# Patient Record
Sex: Male | Born: 1980
Health system: Southern US, Community
[De-identification: ages and names within clinical notes are randomized; demographics above are authoritative.]

---

## 2002-02-21 ENCOUNTER — Emergency Department (HOSPITAL_COMMUNITY): Admission: EM | Admit: 2002-02-21 | Discharge: 2002-02-21 | Payer: Self-pay

## 2002-02-23 ENCOUNTER — Encounter: Payer: Self-pay | Admitting: Emergency Medicine

## 2002-02-23 ENCOUNTER — Emergency Department (HOSPITAL_COMMUNITY): Admission: EM | Admit: 2002-02-23 | Discharge: 2002-02-23 | Payer: Self-pay | Admitting: Emergency Medicine

## 2003-05-16 ENCOUNTER — Encounter: Payer: Self-pay | Admitting: Emergency Medicine

## 2003-05-16 ENCOUNTER — Emergency Department (HOSPITAL_COMMUNITY): Admission: EM | Admit: 2003-05-16 | Discharge: 2003-05-16 | Payer: Self-pay | Admitting: Emergency Medicine

## 2003-11-15 ENCOUNTER — Emergency Department (HOSPITAL_COMMUNITY): Admission: EM | Admit: 2003-11-15 | Discharge: 2003-11-15 | Payer: Self-pay | Admitting: Emergency Medicine

## 2003-11-16 ENCOUNTER — Emergency Department (HOSPITAL_COMMUNITY): Admission: EM | Admit: 2003-11-16 | Discharge: 2003-11-16 | Payer: Self-pay | Admitting: Emergency Medicine

## 2003-11-17 ENCOUNTER — Emergency Department (HOSPITAL_COMMUNITY): Admission: AD | Admit: 2003-11-17 | Discharge: 2003-11-17 | Payer: Self-pay | Admitting: Internal Medicine

## 2015-05-26 ENCOUNTER — Encounter (HOSPITAL_COMMUNITY): Payer: Self-pay | Admitting: Emergency Medicine

## 2015-05-26 ENCOUNTER — Emergency Department (INDEPENDENT_AMBULATORY_CARE_PROVIDER_SITE_OTHER)
Admission: EM | Admit: 2015-05-26 | Discharge: 2015-05-26 | Disposition: A | Discharge status: Home / Self Care | Payer: Self-pay | Source: Home / Self Care | Attending: Family Medicine | Admitting: Family Medicine

## 2015-05-26 DIAGNOSIS — L720 Epidermal cyst: Secondary | ICD-10-CM

## 2015-05-26 NOTE — ED Provider Notes (Signed)
CSN: [id number]     Arrival date & time 05/26/15  1840 History   First MD Initiated Contact with Patient 05/26/15 2033     Chief Complaint  Patient presents with  . Cyst   (Consider location/radiation/quality/duration/timing/severity/associated sxs/prior Treatment) HPI Comments: 34 year old male with an epidermal cyst on his back for approximate 6 months. It is not painful nor is it tender. There is been no signs of infection. He is not trained is been no bleeding. He" thought he could just get it checked out tonight."   History reviewed. No pertinent past medical history. History reviewed. No pertinent past surgical history. No family history on file. Social History  Substance Use Topics  . Smoking status: Current Every Day Smoker  . Smokeless tobacco: None  . Alcohol Use: Yes    Review of Systems  Skin:       Cyst as above.  All other systems reviewed and are negative.   Allergies  Review of patient's allergies indicates no known allergies.  Home Medications   Prior to Admission medications   Not on File   Meds Ordered and Administered this Visit  Medications - No data to display  BP 137/93 mmHg  Pulse 76  Temp(Src) 98.6 F (37 C) (Oral)  Resp 16  SpO2 99% No data found.   Physical Exam  Constitutional: He appears well-developed and well-nourished. No distress.  Neck: Normal range of motion. Neck supple.  Cardiovascular: Normal rate.   Neurological: He is alert. He exhibits normal muscle tone.  Skin: Skin is warm and dry.  Approximately 2 cm raised soft flesh-colored lesion to the upper left back. Nontender. No draining or bleeding. No signs of infection. No discoloration or erythema.  Psychiatric: He has a normal mood and affect.  Nursing note and vitals reviewed.   ED Course  Procedures (including critical care time)  Labs Review Labs Reviewed - No data to display  Imaging Review No results found.   Visual Acuity Review  Right Eye Distance:    Left Eye Distance:   Bilateral Distance:    Right Eye Near:   Left Eye Near:    Bilateral Near:         MDM   1. EIC (epidermal inclusion cyst)    Referred to dermatologist for excision of cyst.    Janne Napoleon, NP 05/26/15 2042

## 2015-05-26 NOTE — Discharge Instructions (Signed)
Epidermal Cyst An epidermal cyst is usually a small, painless lump under the skin. Cysts often occur on the face, neck, stomach, chest, or genitals. The cyst may be filled with a bad smelling paste. Do not pop your cyst. Popping the cyst can cause pain and puffiness (swelling). HOME CARE   Only take medicines as told by your doctor.  Take your medicine (antibiotics) as told. Finish it even if you start to feel better. GET HELP RIGHT AWAY IF:  Your cyst is tender, red, or puffy.  You are not getting better, or you are getting worse.  You have any questions or concerns. MAKE SURE YOU:  Understand these instructions.  Will watch your condition.  Will get help right away if you are not doing well or get worse. Document Released: 09/16/2004 Document Revised: 02/08/2012 Document Reviewed: 02/15/2011 [facility] Patient Information 2015 [place], [place]. This information is not intended to replace advice given to you by your health care provider. Make sure you discuss any questions you have with your health care provider.  Cyst Removal Your caregiver has removed a cyst. A cyst is a sac containing a semi-solid material. Cysts may occur any place on your body. They may remain small for years or gradually get larger. A sebaceous cyst is an enlarged (dilated) sweat gland filled with old sweat (sebum). Unattended, these may become large (the size of a softball) over several years time. These are often removed for improved appearance (cosmetic) reasons or before they become infected to form an abscess. An abscess is an infected cyst. HOME CARE INSTRUCTIONS   Keep your bandage clean and dry. You may change your bandage after 24 hours. If your bandage sticks, use warm water to gently loosen it. Pat the area dry with a clean towel before putting on another bandage.  If possible, keep the area where the cyst was removed raised to relieve soreness, swelling, and promote healing.  If you have stitches, keep  them clean and dry.  You may clean your stitches gently with a cotton swab dipped in warm soapy water.  Do not soak the area where the cyst was removed or go swimming. You may shower.  Do not overuse the area where your cyst was removed.  Return in 7 days or as directed to have your stitches removed.  Take medicines as instructed by your caregiver. SEEK IMMEDIATE MEDICAL CARE IF:   An oral temperature above 102 F (38.9 C) develops, not controlled by medication.  Blood continues to soak through the bandage.  You have increasing pain in the area where your cyst was removed.  You have redness, swelling, pus, a bad smell, soreness (inflammation), or red streaks coming away from the stitches. These are signs of infection. MAKE SURE YOU:   Understand these instructions.  Will watch your condition.  Will get help right away if you are not doing well or get worse. Document Released: 08/06/2000 Document Revised: 11/01/2011 Document Reviewed: 11/30/2007 [facility] Patient Information 2015 [place], [place]. This information is not intended to replace advice given to you by your health care provider. Make sure you discuss any questions you have with your health care provider.

## 2015-05-26 NOTE — ED Notes (Signed)
Patient has a cyst to upper back.  Bump is soft, non-painful.  Patient reports this has been growing for 6 months.

## 2016-11-01 ENCOUNTER — Ambulatory Visit (HOSPITAL_COMMUNITY)
Admission: RE | Admit: 2016-11-01 | Discharge: 2016-11-01 | Disposition: A | Discharge status: Home / Self Care | Payer: Self-pay | Attending: Psychiatry | Admitting: Psychiatry

## 2016-11-01 DIAGNOSIS — F1010 Alcohol abuse, uncomplicated: Secondary | ICD-10-CM | POA: Insufficient documentation

## 2016-11-01 NOTE — H&P (Signed)
Behavioral Health Medical Screening Exam  Andrew [name] is an 36 y.o. male.  Total Time spent with patient: 30 minutes  Psychiatric Specialty Exam: Physical Exam  Nursing note and vitals reviewed. Constitutional: He is oriented to person, place, and time.  Neurological: He is alert and oriented to person, place, and time.  Psychiatric: He has a normal mood and affect. His behavior is normal.    Review of Systems  Psychiatric/Behavioral: Positive for substance abuse.    There were no vitals taken for this visit.There is no height or weight on file to calculate BMI.  General Appearance: Casual  Eye Contact:  Good  Speech:  Clear and Coherent  Volume:  Normal  Mood:  Euthymic  Affect:  Appropriate and Congruent  Thought Process:  Coherent  Orientation:  Full (Time, Place, and Person)  Thought Content:  Hallucinations: None  Suicidal Thoughts:  No  Homicidal Thoughts:  No  Memory:  Immediate;   Good Recent;   Good Remote;   Good  Judgement:  Good  Insight:  Present  Psychomotor Activity:  Normal  Concentration: Concentration: Good  Recall:  Good  Fund of Knowledge:Good  Language: Good  Akathisia:  No  Handed:  Right  AIMS (if indicated):     Assets:  Resilience Social Support  Sleep:       Patient was provided with Substances abuse outpatient resources.   Musculoskeletal: Strength & Muscle Tone: within normal limits Gait & Station: normal Patient leans: N/A  There were no vitals taken for this visit. B/P 126/76 HR 83 100 RA os stat, Temp 98.5, RR 18 Recommendations:  Based on my evaluation the patient does not appear to have an emergency medical condition.  Derrill Center, NP 11/01/2016, 11:53 AM

## 2016-11-01 NOTE — BH Assessment (Signed)
Assessment Note  Andrew [name] is a 36 y.o. male who presents voluntarily to [facility] as a walk in request 7 day detox. Pt denies SI, HI, AVH. Pt shares that he is court ordered to do a 7 day detox. Pt given referrals of agencies that could assist him.    Diagnosis: Alcohol Use Disorder  Past Medical History: No past medical history on file.  No past surgical history on file.  Family History: No family history on file.  Social History:  reports that he has been smoking.  He does not have any smokeless tobacco history on file. He reports that he drinks alcohol. He reports that he does not use drugs.  Additional Social History:  Alcohol / Drug Use Pain Medications: denies Prescriptions: denies Over the Counter: denies History of alcohol / drug use?: Yes (unspecified use of alcohol and marijuana)  CIWA:   COWS:    Allergies: No Known Allergies  Home Medications:  (Not in a hospital admission)  OB/GYN Status:  No LMP for male patient.  General Assessment Data Location of Assessment: [facility] Assessment Services TTS Assessment: In system Is this a Tele or Face-to-Face Assessment?: Face-to-Face Is this an Initial Assessment or a Re-assessment for this encounter?: Initial Assessment Marital status: Single Living Arrangements: Parent Can pt return to current living arrangement?: Yes Admission Status: Voluntary Is patient capable of signing voluntary admission?: Yes Referral Source: Self/Family/Friend  Medical Screening Exam ([place]) Medical Exam completed: Yes  Crisis Care Plan Living Arrangements: Parent Name of Psychiatrist: none Name of Therapist: none  Education Status Is patient currently in school?: No  Risk to self with the past 6 months Suicidal Ideation: No Has patient been a risk to self within the past 6 months prior to admission? : No Suicidal Intent: No Has patient had any suicidal intent within the past 6 months prior to admission? : No Is patient at  risk for suicide?: No Suicidal Plan?: No Has patient had any suicidal plan within the past 6 months prior to admission? : No Access to Means: No What has been your use of drugs/alcohol within the last 12 months?: heavy liquor use Previous Attempts/Gestures: No Intentional Self Injurious Behavior: None Family Suicide History: Unknown Persecutory voices/beliefs?: No Depression: No Substance abuse history and/or treatment for substance abuse?: Yes Suicide prevention information given to non-admitted patients: Not applicable  Risk to Others within the past 6 months Homicidal Ideation: No Does patient have any lifetime risk of violence toward others beyond the six months prior to admission? : No Thoughts of Harm to Others: No Current Homicidal Intent: No Current Homicidal Plan: No Access to Homicidal Means: No History of harm to others?: No Assessment of Violence: None Noted Does patient have access to weapons?: No Criminal Charges Pending?: No Does patient have a court date: Yes Court Date: 11/10/16 Is patient on probation?: Unknown  Psychosis Hallucinations: None noted Delusions: None noted  Mental Status Report Appearance/Hygiene: Unremarkable Eye Contact: Good Motor Activity: Unremarkable Speech: Logical/coherent Level of Consciousness: Alert Mood: Pleasant, Euthymic Affect: Appropriate to circumstance Anxiety Level: Minimal Thought Processes: Coherent, Relevant Judgement: Unimpaired Orientation: Situation, Person, Place, Time, Appropriate for developmental age Obsessive Compulsive Thoughts/Behaviors: None  Cognitive Functioning Concentration: Normal Memory: Remote Intact, Recent Intact IQ: Average Insight: Good Impulse Control: Good Appetite: Good Sleep: No Change Vegetative Symptoms: None  ADLScreening [facility] Assessment Services) Patient's cognitive ability adequate to safely complete daily activities?: Yes Patient able to express need for assistance with  ADLs?: Yes Independently  performs ADLs?: Yes (appropriate for developmental age)  Prior Inpatient Therapy Prior Inpatient Therapy: No  Prior Outpatient Therapy Prior Outpatient Therapy: No Does patient have an ACCT team?: No Does patient have Intensive In-House Services?  : No Does patient have [facility] services? : No Does patient have P4CC services?: No  ADL Screening (condition at time of admission) Patient's cognitive ability adequate to safely complete daily activities?: Yes Is the patient deaf or have difficulty hearing?: No Does the patient have difficulty seeing, even when wearing glasses/contacts?: No Does the patient have difficulty concentrating, remembering, or making decisions?: No Patient able to express need for assistance with ADLs?: Yes Does the patient have difficulty dressing or bathing?: No Independently performs ADLs?: Yes (appropriate for developmental age) Does the patient have difficulty walking or climbing stairs?: No Weakness of Legs: None Weakness of Arms/Hands: None  Home Assistive Devices/Equipment Home Assistive Devices/Equipment: None  Therapy Consults (therapy consults require a physician order) PT Evaluation Needed: No OT Evalulation Needed: No SLP Evaluation Needed: No Abuse/Neglect Assessment (Assessment to be complete while patient is alone) Physical Abuse: Denies Verbal Abuse: Denies Sexual Abuse: Denies Exploitation of patient/patient's resources: Denies Self-Neglect: Denies Values / Beliefs Cultural Requests During Hospitalization: None Spiritual Requests During Hospitalization: None Consults Spiritual Care Consult Needed: No Social Work Consult Needed: No Regulatory affairs officer (For Healthcare) Does Patient Have a Medical Advance Directive?: No Would patient like information on creating a medical advance directive?: No - Patient declined    Additional Information 1:1 In Past 12 Months?: No CIRT Risk: No Elopement Risk: No Does  patient have medical clearance?: Yes     Disposition:  Disposition Initial Assessment Completed for this Encounter: Yes (consulted with Ricky Ala, NP) Disposition of Patient: Other dispositions Other disposition(s): Referred to outside facility ([facility]; [phone])  On Site Evaluation by:   Reviewed with Physician:    Rexene Edison 11/01/2016 11:58 AM

## 2020-03-27 ENCOUNTER — Encounter: Payer: Self-pay | Admitting: Emergency Medicine

## 2020-03-27 ENCOUNTER — Other Ambulatory Visit: Payer: Self-pay

## 2020-03-27 ENCOUNTER — Ambulatory Visit
Admission: EM | Admit: 2020-03-27 | Discharge: 2020-03-27 | Disposition: A | Discharge status: Home / Self Care | Payer: Self-pay | Attending: Emergency Medicine | Admitting: Emergency Medicine

## 2020-03-27 ENCOUNTER — Encounter (HOSPITAL_COMMUNITY): Payer: Self-pay

## 2020-03-27 ENCOUNTER — Inpatient Hospital Stay (HOSPITAL_COMMUNITY)
Admission: EM | Admit: 2020-03-27 | Discharge: 2020-04-02 | DRG: 871 | Disposition: A | Discharge status: Home / Self Care | Payer: Self-pay | Attending: Student | Admitting: Student

## 2020-03-27 DIAGNOSIS — R61 Generalized hyperhidrosis: Secondary | ICD-10-CM | POA: Insufficient documentation

## 2020-03-27 DIAGNOSIS — R9431 Abnormal electrocardiogram [ECG] [EKG]: Secondary | ICD-10-CM | POA: Insufficient documentation

## 2020-03-27 DIAGNOSIS — B349 Viral infection, unspecified: Secondary | ICD-10-CM | POA: Insufficient documentation

## 2020-03-27 DIAGNOSIS — D509 Iron deficiency anemia, unspecified: Secondary | ICD-10-CM | POA: Diagnosis present

## 2020-03-27 DIAGNOSIS — R7401 Elevation of levels of liver transaminase levels: Secondary | ICD-10-CM | POA: Diagnosis present

## 2020-03-27 DIAGNOSIS — J984 Other disorders of lung: Secondary | ICD-10-CM

## 2020-03-27 DIAGNOSIS — I517 Cardiomegaly: Secondary | ICD-10-CM | POA: Insufficient documentation

## 2020-03-27 DIAGNOSIS — R0600 Dyspnea, unspecified: Secondary | ICD-10-CM | POA: Insufficient documentation

## 2020-03-27 DIAGNOSIS — J189 Pneumonia, unspecified organism: Secondary | ICD-10-CM

## 2020-03-27 DIAGNOSIS — Z87891 Personal history of nicotine dependence: Secondary | ICD-10-CM

## 2020-03-27 DIAGNOSIS — R000 Tachycardia, unspecified: Secondary | ICD-10-CM | POA: Insufficient documentation

## 2020-03-27 DIAGNOSIS — F1210 Cannabis abuse, uncomplicated: Secondary | ICD-10-CM | POA: Diagnosis present

## 2020-03-27 DIAGNOSIS — M25511 Pain in right shoulder: Secondary | ICD-10-CM | POA: Insufficient documentation

## 2020-03-27 DIAGNOSIS — E876 Hypokalemia: Secondary | ICD-10-CM | POA: Diagnosis present

## 2020-03-27 DIAGNOSIS — A419 Sepsis, unspecified organism: Principal | ICD-10-CM | POA: Diagnosis present

## 2020-03-27 DIAGNOSIS — Z789 Other specified health status: Secondary | ICD-10-CM

## 2020-03-27 DIAGNOSIS — D696 Thrombocytopenia, unspecified: Secondary | ICD-10-CM | POA: Diagnosis not present

## 2020-03-27 DIAGNOSIS — J850 Gangrene and necrosis of lung: Secondary | ICD-10-CM | POA: Diagnosis present

## 2020-03-27 DIAGNOSIS — Z20822 Contact with and (suspected) exposure to covid-19: Secondary | ICD-10-CM | POA: Diagnosis present

## 2020-03-27 DIAGNOSIS — E871 Hypo-osmolality and hyponatremia: Secondary | ICD-10-CM | POA: Diagnosis present

## 2020-03-27 LAB — COMPREHENSIVE METABOLIC PANEL
ALT: 57 U/L — ABNORMAL HIGH (ref 0–44)
AST: 30 U/L (ref 15–41)
Albumin: 2.3 g/dL — ABNORMAL LOW (ref 3.5–5.0)
Alkaline Phosphatase: 103 U/L (ref 38–126)
Anion gap: 14 (ref 5–15)
BUN: 11 mg/dL (ref 6–20)
CO2: 29 mmol/L (ref 22–32)
Calcium: 8.5 mg/dL — ABNORMAL LOW (ref 8.9–10.3)
Chloride: 87 mmol/L — ABNORMAL LOW (ref 98–111)
Creatinine, Ser: 0.96 mg/dL (ref 0.61–1.24)
GFR calc Af Amer: >60 mL/min (ref >60)
GFR calc non Af Amer: >60 mL/min (ref >60)
Glucose, Bld: 163 mg/dL — ABNORMAL HIGH (ref 70–99)
Potassium: 3.3 mmol/L — ABNORMAL LOW (ref 3.5–5.1)
Sodium: 130 mmol/L — ABNORMAL LOW (ref 135–145)
Total Bilirubin: 1.9 mg/dL — ABNORMAL HIGH (ref 0.3–1.2)
Total Protein: 7.7 g/dL (ref 6.5–8.1)

## 2020-03-27 LAB — CBC
HCT: 30.7 % — ABNORMAL LOW (ref 39.0–52.0)
Hemoglobin: 9.9 g/dL — ABNORMAL LOW (ref 13.0–17.0)
MCH: 27.3 pg (ref 26.0–34.0)
MCHC: 32.2 g/dL (ref 30.0–36.0)
MCV: 84.6 fL (ref 80.0–100.0)
Platelets: 622 10*3/uL — ABNORMAL HIGH (ref 150–400)
RBC: 3.63 MIL/uL — ABNORMAL LOW (ref 4.22–5.81)
RDW: 13.2 % (ref 11.5–15.5)
WBC: 31.6 10*3/uL — ABNORMAL HIGH (ref 4.0–10.5)
nRBC: 0 % (ref 0.0–0.2)

## 2020-03-27 LAB — D-DIMER, QUANTITATIVE: D-Dimer, Quant: 3.76 ug/mL-FEU — ABNORMAL HIGH (ref 0.00–0.50)

## 2020-03-27 LAB — TROPONIN I (HIGH SENSITIVITY): Troponin I (High Sensitivity): 17 ng/L (ref <18)

## 2020-03-27 NOTE — ED Triage Notes (Signed)
Pt arrives POV for eval of upper mid back pain onset 2 days. Pt reports pain worse w/ movement/breathing. Also c/o URI sx, tested for covid there, but here for r/o PE d/t tachycardia and R atrial enlargement on 12 lead.

## 2020-03-27 NOTE — ED Triage Notes (Addendum)
Pt presents to [facility] for assessment of cough, fatigue, lack of appetite, and insomnia/worrying about what's going on.  Pt tachy at triage/  Denies CP or shortness of breath.  C/o right shoulder/scapula pain, but worse with movement.

## 2020-03-27 NOTE — ED Notes (Signed)
Provider messaged about fever, patient took tylenol 3 hours ago (Theraflu).  Awaiting orders for fever control

## 2020-03-27 NOTE — Discharge Instructions (Addendum)
Your COVID test is pending - it is important to quarantine / isolate at home until your results are back. °If you test positive and would like further evaluation for persistent or worsening symptoms, you may schedule an E-visit or virtual (video) visit throughout the [facility] MyChart app or website. ° °PLEASE NOTE: If you develop severe chest pain or shortness of breath please go to the ER or call 9-1-1 for further evaluation --> DO NOT schedule electronic or virtual visits for this. °Please call our office for further guidance / recommendations as needed. ° °For information about the Covid vaccine, please visit [email or web address] °

## 2020-03-27 NOTE — ED Notes (Signed)
Called for vitals no response x1

## 2020-03-27 NOTE — ED Provider Notes (Signed)
[facility]    CSN: [id number] Arrival date & time: 03/27/20  1738      History   Chief Complaint Chief Complaint  Patient presents with  . Fatigue  . Cough    HPI Andrew [name] is a 39 y.o. male presenting for few day course of nonproductive cough, fatigue, lack of appetite and insomnia.  Also noting tachycardia and fever at home.  No known sick contacts.  Does note right shoulder/scapular pain that is worse with movement.  Denies history of DVT, PE, CVA, MI.  No recent change in diet or medications.  Did stop using alcohol (denies heavy use, seizure) over 1 week ago.  Feels this may be contributing to his insomnia.  Denies SI/HI.    History reviewed. No pertinent past medical history.  There are no problems to display for this patient.   History reviewed. No pertinent surgical history.     Home Medications    Prior to Admission medications   Not on File    Family History Family History  Problem Relation Age of Onset  . Healthy Mother     Social History Social History   Tobacco Use  . Smoking status: Former Research scientist (life sciences)  . Smokeless tobacco: Never Used  Substance Use Topics  . Alcohol use: Yes  . Drug use: No     Allergies   Patient has no known allergies.   Review of Systems As per HPI   Physical Exam Triage Vital Signs ED Triage Vitals  Enc Vitals Group     BP 03/27/20 1751 (!) 145/76     Pulse Rate 03/27/20 1751 (!) 140     Resp 03/27/20 1751 18     Temp 03/27/20 1751 (!) 100.8 F (38.2 C)     Temp Source 03/27/20 1751 Oral     SpO2 03/27/20 1751 92 %     Weight --      Height --      Head Circumference --      Peak Flow --      Pain Score 03/27/20 1752 0     Pain Loc --      Pain Edu? --      Excl. in [place]? --    No data found.  Updated Vital Signs BP (!) 145/76   Pulse (!) 114 Comment: Per APP  Temp (!) 100.8 F (38.2 C) (Oral)   Resp 18   SpO2 92%   Visual Acuity Right Eye Distance:   Left Eye Distance:    Bilateral Distance:    Right Eye Near:   Left Eye Near:    Bilateral Near:     Physical Exam Constitutional:      General: He is not in acute distress.    Appearance: He is diaphoretic.  HENT:     Head: Normocephalic and atraumatic.  Eyes:     General: No scleral icterus.    Pupils: Pupils are equal, round, and reactive to light.  Neck:     Comments: Trachea midline, negative JVD Cardiovascular:     Rate and Rhythm: Regular rhythm. Tachycardia present.  Pulmonary:     Effort: Pulmonary effort is normal. No respiratory distress.     Breath sounds: No wheezing, rhonchi or rales.  Skin:    Capillary Refill: Capillary refill takes less than 2 seconds.     Coloration: Skin is not jaundiced or pale.  Neurological:     General: No focal deficit present.     Mental  Status: He is alert and oriented to person, place, and time.      UC Treatments / Results  Labs (all labs ordered are listed, but only abnormal results are displayed) Labs Reviewed  NOVEL CORONAVIRUS, NAA    EKG   Radiology No results found.  Procedures Procedures (including critical care time)  Medications Ordered in UC Medications - No data to display  Initial Impression / Assessment and Plan / UC Course  I have reviewed the triage vital signs and the nursing notes.  Pertinent labs & imaging results that were available during my care of the patient were reviewed by me and considered in my medical decision making (see chart for details).     Patient febrile, nontoxic in office today.  HR 114-132.  EKG done office, reviewed by me without previous to compare.  Sinus tachycardia with ventricular rate 126 bpm.  No QTC prolongation, ST elevation or depression.  Patient does have right atrial enlargement.  Given tachycardia, fatigue, cough, right scapular pain, recommend patient go to ER for further evaluation this cannot rule out acute process such as PE.  Patient declined transportation.  Electing to self  transport and personal vehicle with his mother in stable condition.  Return precautions discussed, pt verbalized understanding and is agreeable to plan. Final Clinical Impressions(s) / UC Diagnoses   Final diagnoses:  Viral illness  Tachycardia  Right atrial enlargement  Abnormal EKG  Acute pain of right shoulder  Diaphoresis  Dyspnea, unspecified type     Discharge Instructions     Your COVID test is pending - it is important to quarantine / isolate at home until your results are back. If you test positive and would like further evaluation for persistent or worsening symptoms, you may schedule an E-visit or virtual (video) visit throughout the [facility] app or website.  PLEASE NOTE: If you develop severe chest pain or shortness of breath please go to the ER or call 9-1-1 for further evaluation --> DO NOT schedule electronic or virtual visits for this. Please call our office for further guidance / recommendations as needed.  For information about the Covid vaccine, please visit [email or web address]    ED Prescriptions    None     PDMP not reviewed this encounter.   Neldon Mc [place], [place] 03/27/20 1939

## 2020-03-28 ENCOUNTER — Emergency Department (HOSPITAL_COMMUNITY): Payer: Self-pay

## 2020-03-28 ENCOUNTER — Inpatient Hospital Stay (HOSPITAL_COMMUNITY): Payer: Self-pay

## 2020-03-28 DIAGNOSIS — E876 Hypokalemia: Secondary | ICD-10-CM

## 2020-03-28 DIAGNOSIS — J850 Gangrene and necrosis of lung: Secondary | ICD-10-CM | POA: Diagnosis present

## 2020-03-28 DIAGNOSIS — K029 Dental caries, unspecified: Secondary | ICD-10-CM

## 2020-03-28 DIAGNOSIS — E871 Hypo-osmolality and hyponatremia: Secondary | ICD-10-CM

## 2020-03-28 DIAGNOSIS — F1210 Cannabis abuse, uncomplicated: Secondary | ICD-10-CM

## 2020-03-28 DIAGNOSIS — R509 Fever, unspecified: Secondary | ICD-10-CM

## 2020-03-28 DIAGNOSIS — A419 Sepsis, unspecified organism: Principal | ICD-10-CM

## 2020-03-28 DIAGNOSIS — Z7289 Other problems related to lifestyle: Secondary | ICD-10-CM

## 2020-03-28 DIAGNOSIS — Z789 Other specified health status: Secondary | ICD-10-CM

## 2020-03-28 DIAGNOSIS — J188 Other pneumonia, unspecified organism: Secondary | ICD-10-CM

## 2020-03-28 LAB — COMPREHENSIVE METABOLIC PANEL
ALT: 44 U/L (ref 0–44)
AST: 24 U/L (ref 15–41)
Albumin: 1.8 g/dL — ABNORMAL LOW (ref 3.5–5.0)
Alkaline Phosphatase: 75 U/L (ref 38–126)
Anion gap: 11 (ref 5–15)
BUN: 7 mg/dL (ref 6–20)
CO2: 30 mmol/L (ref 22–32)
Calcium: 8 mg/dL — ABNORMAL LOW (ref 8.9–10.3)
Chloride: 93 mmol/L — ABNORMAL LOW (ref 98–111)
Creatinine, Ser: 0.73 mg/dL (ref 0.61–1.24)
GFR calc Af Amer: >60 mL/min (ref >60)
GFR calc non Af Amer: >60 mL/min (ref >60)
Glucose, Bld: 130 mg/dL — ABNORMAL HIGH (ref 70–99)
Potassium: 3.1 mmol/L — ABNORMAL LOW (ref 3.5–5.1)
Sodium: 134 mmol/L — ABNORMAL LOW (ref 135–145)
Total Bilirubin: 1.9 mg/dL — ABNORMAL HIGH (ref 0.3–1.2)
Total Protein: 6.5 g/dL (ref 6.5–8.1)

## 2020-03-28 LAB — CBC WITH DIFFERENTIAL/PLATELET
Abs Immature Granulocytes: 0 10*3/uL (ref 0.00–0.07)
Basophils Absolute: 0 10*3/uL (ref 0.0–0.1)
Basophils Relative: 0 %
Eosinophils Absolute: 0 10*3/uL (ref 0.0–0.5)
Eosinophils Relative: 0 %
HCT: 24.6 % — ABNORMAL LOW (ref 39.0–52.0)
Hemoglobin: 8.2 g/dL — ABNORMAL LOW (ref 13.0–17.0)
Lymphocytes Relative: 11 %
Lymphs Abs: 3.2 10*3/uL (ref 0.7–4.0)
MCH: 27.5 pg (ref 26.0–34.0)
MCHC: 33.3 g/dL (ref 30.0–36.0)
MCV: 82.6 fL (ref 80.0–100.0)
Monocytes Absolute: 2.3 10*3/uL — ABNORMAL HIGH (ref 0.1–1.0)
Monocytes Relative: 8 %
Neutro Abs: 23.3 10*3/uL — ABNORMAL HIGH (ref 1.7–7.7)
Neutrophils Relative %: 81 %
Platelets: 646 10*3/uL — ABNORMAL HIGH (ref 150–400)
RBC: 2.98 MIL/uL — ABNORMAL LOW (ref 4.22–5.81)
RDW: 13.2 % (ref 11.5–15.5)
WBC: 28.8 10*3/uL — ABNORMAL HIGH (ref 4.0–10.5)
nRBC: 0 % (ref 0.0–0.2)
nRBC: 0 /100 WBC

## 2020-03-28 LAB — LACTIC ACID, PLASMA
Lactic Acid, Venous: 1.4 mmol/L (ref 0.5–1.9)
Lactic Acid, Venous: 1.2 mmol/L (ref 0.5–1.9)
Lactic Acid, Venous: 1.2 mmol/L (ref 0.5–1.9)

## 2020-03-28 LAB — TROPONIN I (HIGH SENSITIVITY): Troponin I (High Sensitivity): 10 ng/L (ref <18)

## 2020-03-28 LAB — PROTIME-INR
INR: 1.3 — ABNORMAL HIGH (ref 0.8–1.2)
Prothrombin Time: 15.8 seconds — ABNORMAL HIGH (ref 11.4–15.2)

## 2020-03-28 LAB — SARS CORONAVIRUS 2 BY RT PCR (HOSPITAL ORDER, PERFORMED IN ~~LOC~~ HOSPITAL LAB): SARS Coronavirus 2: NEGATIVE

## 2020-03-28 LAB — HIV ANTIBODY (ROUTINE TESTING W REFLEX): HIV Screen 4th Generation wRfx: NONREACTIVE

## 2020-03-28 LAB — APTT: aPTT: 43 seconds — ABNORMAL HIGH (ref 24–36)

## 2020-03-28 MED ORDER — LACTATED RINGERS IV SOLN: INTRAVENOUS | Status: DC

## 2020-03-28 MED ORDER — KETOROLAC TROMETHAMINE 30 MG/ML IJ SOLN
30.0000 mg | Freq: Once | INTRAMUSCULAR | Status: AC
  Administered 2020-03-30: 30 mg via INTRAVENOUS
  Filled 2020-03-28: qty 1

## 2020-03-28 MED ORDER — GUAIFENESIN ER 600 MG PO TB12
600.0000 mg | ORAL_TABLET | Freq: Two times a day (BID) | ORAL | Status: DC
  Administered 2020-03-28 – 2020-04-02 (×11): 600 mg via ORAL
  Filled 2020-03-28 (×11): qty 1

## 2020-03-28 MED ORDER — SODIUM CHLORIDE 0.9 % IV SOLN
2.0000 g | Freq: Once | INTRAVENOUS | Status: AC
  Administered 2020-03-28: 2 g via INTRAVENOUS
  Filled 2020-03-28: qty 2

## 2020-03-28 MED ORDER — HYDROCODONE-HOMATROPINE 5-1.5 MG/5ML PO SYRP
5.0000 mL | ORAL_SOLUTION | Freq: Four times a day (QID) | ORAL | Status: DC | PRN
  Administered 2020-04-01 – 2020-04-02 (×3): 5 mL via ORAL
  Filled 2020-03-28 (×3): qty 5

## 2020-03-28 MED ORDER — LACTATED RINGERS IV BOLUS (SEPSIS)
1000.0000 mL | INTRAVENOUS | Status: AC
  Administered 2020-03-28 (×2): 1000 mL via INTRAVENOUS

## 2020-03-28 MED ORDER — VANCOMYCIN HCL 1250 MG/250ML IV SOLN
1250.0000 mg | Freq: Once | INTRAVENOUS | Status: AC
  Administered 2020-03-28: 1250 mg via INTRAVENOUS
  Filled 2020-03-28: qty 250

## 2020-03-28 MED ORDER — IPRATROPIUM-ALBUTEROL 0.5-2.5 (3) MG/3ML IN SOLN: 3.0000 mL | RESPIRATORY_TRACT | Status: DC | PRN

## 2020-03-28 MED ORDER — ENOXAPARIN SODIUM 40 MG/0.4ML ~~LOC~~ SOLN
40.0000 mg | SUBCUTANEOUS | Status: DC
  Administered 2020-03-28 – 2020-04-02 (×6): 40 mg via SUBCUTANEOUS
  Filled 2020-03-28 (×6): qty 0.4

## 2020-03-28 MED ORDER — LACTATED RINGERS IV BOLUS (SEPSIS)
1000.0000 mL | Freq: Once | INTRAVENOUS | Status: AC
  Administered 2020-03-28: 1000 mL via INTRAVENOUS

## 2020-03-28 MED ORDER — IOHEXOL 350 MG/ML SOLN
100.0000 mL | Freq: Once | INTRAVENOUS | Status: AC | PRN
  Administered 2020-03-28: 100 mL via INTRAVENOUS

## 2020-03-28 MED ORDER — POTASSIUM CHLORIDE CRYS ER 20 MEQ PO TBCR
40.0000 meq | EXTENDED_RELEASE_TABLET | Freq: Once | ORAL | Status: AC
  Administered 2020-03-28: 40 meq via ORAL
  Filled 2020-03-28: qty 2

## 2020-03-28 MED ORDER — ACETAMINOPHEN 325 MG PO TABS
650.0000 mg | ORAL_TABLET | Freq: Four times a day (QID) | ORAL | Status: DC | PRN
  Administered 2020-03-28 – 2020-03-31 (×8): 650 mg via ORAL
  Filled 2020-03-28 (×8): qty 2

## 2020-03-28 MED ORDER — LACTATED RINGERS IV BOLUS
1000.0000 mL | Freq: Once | INTRAVENOUS | Status: AC
  Administered 2020-03-28: 1000 mL via INTRAVENOUS

## 2020-03-28 MED ORDER — IPRATROPIUM-ALBUTEROL 0.5-2.5 (3) MG/3ML IN SOLN
3.0000 mL | Freq: Four times a day (QID) | RESPIRATORY_TRACT | Status: DC
  Administered 2020-03-28: 3 mL via RESPIRATORY_TRACT
  Filled 2020-03-28: qty 3

## 2020-03-28 MED ORDER — SODIUM CHLORIDE 0.9 % IV SOLN
3.0000 g | Freq: Four times a day (QID) | INTRAVENOUS | Status: DC
  Administered 2020-03-28 – 2020-04-02 (×22): 3 g via INTRAVENOUS
  Filled 2020-03-28 (×25): qty 8

## 2020-03-28 NOTE — Consult Note (Signed)
NAME:  Andrew [name], MRN:  [id number], DOB:  [birth year], LOS: 0 ADMISSION DATE:  03/27/2020, CONSULTATION DATE:  03/28/20 REFERRING MD:  Kathie Dike, MD, CHIEF COMPLAINT:  Chest pain, cough  Brief History   39 year old man with no PMH presented with several days of left upper chest pain and cough found to have cavitary pneumonia on CT scan whom we are consulted for treatment recommendations.   History of present illness   Patient is healthy man. Was in usual state of health last week when traveled to [place] to visit sister. Late in the trip this weekend he noted left upper chest pain. He thought it was related to his job where he [employer] and does a lot of lifting of heavy objects. Pain not worse with position or movement. Not worse with breathing in or out.  No exertional chest pain.  Nothing makes pain better. Pain is described as mild to moderate.   Shortly after developing chest pain patient reports onset of cough.  Productive in nature.  Phlegm is yellow.  Cough has been present last several days. Cough described as moderate. Nothing seems to make cough worse. Nothing makes the cough better.   Patient reports low-grade fever at home the last couple of days.  As high as around 100-100.5.  Has felt warm and diaphoretic.  No drenching night sweats.  Has not taken any medication to try to break the fever.  Patient in the cigarettes.  Endorses significant marijuana smoking in the past, 3 to 4 cigars filled with marijuana a day.  He is cut back more recently.  Denies any other inhalational exposure or drug use.  Does not snort drugs, does not use IV drugs.  He has never been homeless.  He has never been incarcerated.  No family members have been ill and he is aware of no family members that have had or been treated for TB.  He denies exposure to HIV.  He denies history of reflux or nausea with vomiting around the time of symptom onset.  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  Onset the last couple of days.  He thinks related to chest pain and cough described above.  Had to go to the truck and rest.  Given he could no longer work, this prompted him to present to the emergency room for further evaluation.  In the ED, initial vitals notable for normal blood pressure, and normal respiratory rate along with tachycardia to 130.  Throughout his stay in the ED his blood pressures have been normotensive with ongoing significant sinus tachycardia and development of fever to 101 F and tachypnea.  Initial labs notable for hyponatremia 130, hypokalemia 3.3, T bili elevated at 1.9 with hypoalbuminemia.  CBC notable for anemia with hemoglobin 9.9 and leukocytosis to 31,000 as well as likely reactive f thrombocytosis of 622.  D-dimer was elevated which prompted CTA chest that demonstrated no PE but bilateral peripheral and paramediastinal cystic emphysematous changes and extensive right upper lobe consolidation with appearance of air-fluid levels on my interpretation consistent with pneumonia and superinfected pulmonary bullae.  He was given vancomycin and cefepime.  He was admitted to the hospitalist.  Per review of H&P hospitalist discussed case with ID and AFB sputum's as well as lower respiratory culture was ordered.  Recommended Unasyn for IV antimicrobial coverage.  Consultation with pulmonary was recommended for consideration of bronchoscopy.  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, does not smoke cigarettes, had been smoking 3 to 4 cigars filled with marijuana daily although he reports he is cut back in recent months, has 2-4 drinks a few nights a week  Consults:    Infectious disease  Procedures:  N/A  Significant Diagnostic Tests:  03/28/2020 CTA chest PE protocol: Bilateral peripheral paramediastinal cystic emphysematous changes and extensive consolidation of right upper lobe with what appear to be superinfected bullae versus cavitary pneumonia with air-fluid levels  Micro Data:  Pending  Antimicrobials:  Status post 1 dose vancomycin and cefepime, Unasyn ordered  Interim history/subjective:  n/a  Objective   Blood pressure (!) 145/81, pulse (!) 137, temperature (!) 101 F (38.3 C), temperature source Oral, resp. rate (!) 24, height 5\' 8"  (1.727 m), weight 65.8 kg, SpO2 97 %.        Intake/Output Summary (Last 24 hours) at 03/28/2020 1032 Last data filed at 03/28/2020 1003 Gross per 24 hour  Intake 431.34 ml  Output --  Net 431.34 ml   Filed Weights   03/27/20 2044  Weight: 65.8 kg    Examination: General: Well-built, mildly diaphoretic, in no acute distress Eyes: EOMI, slight jaundice ENT: Several cavities noted bilaterally particularly molars with some missing molars, no lesions Lungs: Diminished right upper lobe, distant breath sounds otherwise bilaterally, no wheezing Cardiovascular: Tachycardic, no murmurs appreciated Abdomen: Soft, nontender, bowel sounds present Extremities: Warm, Neuro: Moving all extremities freely, no weakness, cranial nerves intact Psych: Normal mood, full affect    Assessment & Plan:  39 year old man with no PMH presented with several days of left upper chest pain and cough found to have cavitary pneumonia on CT scan whom we are consulted for treatment recommendations and consideration of bronchoscopy.   Cavitary pneumonia: Favor cavitary lesions to represent superinfected bullae given significant emphysematous changes on CT scan but cannot rule out necrotizing pneumonia.  Could be community-acquired bug or result of aspiration (posterior right upper lobe is common for supine aspiration). These  infections are near universally polymicrobial and involve anaerobes. Poor dentition is additional risk factor for anaerobic infection. Patient is producing sputum and lower respiratory culture should be obtained although would caution against treating single bacteria if isolated.  Respiratory culture culture may reveal resistant bug  not currently being treated.  Patient has no current identifiable risk factors for TB, HIV test pending. AFB sputume ordered. --Agree Unasyn for polymicrobial and anaerobic coverage --Patient will require 4 to 6 weeks of antibiotic therapy for cavitary lesions with subsequent imaging to demonstrate improvement/resolution, once deemed medically appropriate oral Augmentin would be reasonable choice pending culture data --No role for bronchoscopy at this time --Consider consultation with thoracic surgery for evaluation of necrotizing pneumonia especially if he does not demonstrate clinical improvement with antibiotics   Labs   Labs personally reviewed and as per EMR CBC: Recent Labs  Lab 03/27/20 2048 03/28/20 0909  WBC 31.6* 28.8*  NEUTROABS  --  23.3*  HGB 9.9* 8.2*  HCT 30.7* 24.6*  MCV 84.6 82.6  PLT 622* 646*    Basic Metabolic Panel: Recent Labs  Lab 03/27/20 2048 03/28/20 0909  NA 130* 134*  K 3.3* 3.1*  CL 87* 93*  CO2 29 30  GLUCOSE 163* 130*  BUN 11 7  CREATININE 0.96 0.73  CALCIUM 8.5* 8.0*   GFR: Estimated Creatinine Clearance: 115.4 mL/min (by C-G formula based on SCr of 0.73 mg/dL). Recent Labs  Lab 03/27/20 2048 03/28/20 0750 03/28/20 0909  WBC 31.6*  --  28.8*  LATICACIDVEN  --  1.4 1.2    Liver Function Tests: Recent Labs  Lab 03/27/20 2048 03/28/20 0909  AST 30 24  ALT 57* 44  ALKPHOS 103 75  BILITOT 1.9* 1.9*  PROT 7.7 6.5  ALBUMIN 2.3* 1.8*    Coagulation Profile: Recent Labs  Lab 03/28/20 0909  INR 1.3*     Review of Systems:   Denies any headache.  No significant weight loss.  No drenching night  sweats.  No hemoptysis.  Comprehensive review of systems otherwise negative.  Allergies No Known Allergies   Home Medications  Prior to Admission medications   Not on File

## 2020-03-28 NOTE — ED Notes (Signed)
Lunch Tray Ordered @ 1043. 

## 2020-03-28 NOTE — H&P (Addendum)
History and Physical    Andrew [name]:[id number] DOB: 1981-05-07 DOA: 03/27/2020  PCP: Patient, No Pcp Per  Patient coming from: Home  I have personally briefly reviewed patient's old medical records in [place]  Chief Complaint: Cough and upper back pain  HPI: Andrew [name] is a 39 y.o. male with no significant past medical history who is not on any chronic medications, presents to the hospital with a 7 to 10-day history of productive cough.  He reports producing yellow-colored sputum.  He has been having low-grade temperatures between 100-101F over the past week.  He also complains of pain in his right shoulder blade which is worse with movement and deep inspiration.  He has been feeling generally weak and fatigued.  Denies any significant shortness of breath at this time.  He has not had any vomiting, diarrhea.  He did reports drinking alcohol on a daily basis but has not had anything to drink in about 2 weeks now.  ED Course: Upon arrival to the emergency room was noted to be tachycardic.  Blood pressure stable.  D-dimer elevated.  CT angiogram of the chest negative for pulmonary embolism but shows extensive cavitation of the right upper and middle lobe.  Patient received vancomycin and cefepime in the emergency room.  He has been referred for admission.  Review of Systems:  Review of Systems  Constitutional: Positive for diaphoresis, fever and malaise/fatigue.  Eyes: Negative for blurred vision and double vision.  Respiratory: Positive for cough and sputum production.   Cardiovascular: Negative for chest pain, palpitations and leg swelling.  Gastrointestinal: Negative for abdominal pain, diarrhea, nausea and vomiting.  Genitourinary: Negative for dysuria and urgency.  Musculoskeletal: Positive for back pain.  Neurological: Negative for dizziness and headaches.  .    History reviewed. No pertinent past medical history.  History reviewed. No pertinent surgical  history.  Social History:  reports that he has quit smoking. He has never used smokeless tobacco. He reports current alcohol use. He reports that he does not use drugs.  No Known Allergies  Family History  Problem Relation Age of Onset  . Healthy Mother    Family history: Family history reviewed and not pertinent  Prior to Admission medications   Not on File    Physical Exam: Vitals:   03/27/20 2044 03/28/20 0308 03/28/20 0545 03/28/20 0713  BP:  127/84 127/76   Pulse:  (!) 129    Resp:  18  (!) 28  Temp:      TempSrc:      SpO2:  97%    Weight: 65.8 kg     Height: 5\' 8"  (1.727 m)       Constitutional: NAD, calm, comfortable Eyes: PERRL, lids and conjunctivae normal ENMT: Mucous membranes are moist. Posterior pharynx clear of any exudate or lesions.Normal dentition.  Neck: normal, supple, no masses, no thyromegaly Respiratory: clear to auscultation bilaterally, no wheezing, no crackles. Normal respiratory effort. No accessory muscle use.  Cardiovascular: Tachycardic, regular. No extremity edema. 2+ pedal pulses. No carotid bruits.  Abdomen: no tenderness, no masses palpated. No hepatosplenomegaly. Bowel sounds positive.  Musculoskeletal: no clubbing / cyanosis. No joint deformity upper and lower extremities. Good ROM, no contractures. Normal muscle tone.  Skin: no rashes, lesions, ulcers. No induration Neurologic: CN 2-12 grossly intact. Sensation intact, DTR normal. Strength 5/5 in all 4.  Psychiatric: Normal judgment and insight. Alert and oriented x 3. Normal mood.    Labs on Admission: I have personally  reviewed following labs and imaging studies  CBC: Recent Labs  Lab 03/27/20 2048  WBC 31.6*  HGB 9.9*  HCT 30.7*  MCV 84.6  PLT 287*   Basic Metabolic Panel: Recent Labs  Lab 03/27/20 2048  NA 130*  K 3.3*  CL 87*  CO2 29  GLUCOSE 163*  BUN 11  CREATININE 0.96  CALCIUM 8.5*   GFR: Estimated Creatinine Clearance: 96.1 mL/min (by C-G formula based  on SCr of 0.96 mg/dL). Liver Function Tests: Recent Labs  Lab 03/27/20 2048  AST 30  ALT 57*  ALKPHOS 103  BILITOT 1.9*  PROT 7.7  ALBUMIN 2.3*   No results for input(s): LIPASE, AMYLASE in the last 168 hours. No results for input(s): AMMONIA in the last 168 hours. Coagulation Profile: No results for input(s): INR, PROTIME in the last 168 hours. Cardiac Enzymes: No results for input(s): CKTOTAL, CKMB, CKMBINDEX, TROPONINI in the last 168 hours. BNP (last 3 results) No results for input(s): PROBNP in the last 8760 hours. HbA1C: No results for input(s): HGBA1C in the last 72 hours. CBG: No results for input(s): GLUCAP in the last 168 hours. Lipid Profile: No results for input(s): CHOL, HDL, LDLCALC, TRIG, CHOLHDL, LDLDIRECT in the last 72 hours. Thyroid Function Tests: No results for input(s): TSH, T4TOTAL, FREET4, T3FREE, THYROIDAB in the last 72 hours. Anemia Panel: No results for input(s): VITAMINB12, FOLATE, FERRITIN, TIBC, IRON, RETICCTPCT in the last 72 hours. Urine analysis: No results found for: COLORURINE, APPEARANCEUR, LABSPEC, PHURINE, GLUCOSEU, HGBUR, BILIRUBINUR, KETONESUR, PROTEINUR, UROBILINOGEN, NITRITE, LEUKOCYTESUR  Radiological Exams on Admission: CT Angio Chest PE W and/or Wo Contrast  Result Date: 03/28/2020 CLINICAL DATA:  Chest pain that worsens with breathing EXAM: CT ANGIOGRAPHY CHEST WITH CONTRAST TECHNIQUE: Multidetector CT imaging of the chest was performed using the standard protocol during bolus administration of intravenous contrast. Multiplanar CT image reconstructions and MIPs were obtained to evaluate the vascular anatomy. CONTRAST:  177mL OMNIPAQUE IOHEXOL 350 MG/ML SOLN COMPARISON:  None. FINDINGS: Cardiovascular: Satisfactory opacification of the pulmonary arteries to the segmental level. No evidence of pulmonary embolism. Normal heart size. No pericardial effusion. Mediastinum/Nodes: No enlarged mediastinal, hilar, or axillary lymph nodes.  Thyroid gland, trachea, and esophagus demonstrate no significant findings. Lungs/Pleura: There are biapical bullae. Much of the right lung is necrotic and filled with free air and fluid. There is extensive ground-glass opacity in the right upper and middle lobes. The left lung is relatively normal aside from multiple medial bullae. Upper Abdomen: No acute abnormality. Musculoskeletal: No chest wall abnormality. No acute or significant osseous findings. Review of the MIP images confirms the above findings. IMPRESSION: 1. No pulmonary embolism. 2. Large scale cavitation of the right lung, with multifocal ground-glass opacity in the right upper and middle lobes. This likely indicates necrotizing pneumonia. 3. Biapical bullae. Electronically Signed   By: Ulyses Jarred M.D.   On: 03/28/2020 02:47    EKG: Independently reviewed.  Sinus tachycardia  Assessment/Plan Active Problems:   Necrotizing pneumonia (HCC)   Sepsis (HCC)   Alcohol use   Cannabis abuse   Hyponatremia   Hypokalemia     1. Sepsis.  Secondary to underlying pneumonia.  Patient ruled in for sepsis with SIRS criteria (tachycardia, increased respiratory rate, leukocytosis) and source of infection is pneumonia.  Lactic acid currently pending, but if greater than 2, he would meet criteria for severe sepsis.  Follow-up blood cultures.  Continue on antibiotics.  Continue IV fluids.  He is still tachycardic, but blood pressures currently  stable. 2. Cavitary pneumonia, concern for necrotizing pneumonia.  Discussed with infectious disease, Dr. Drucilla Schmidt.  Will place patient on airborne precautions.  Check AFP.  Check sputum culture.  It is possible that he may have had an aspiration pneumonia with his history of alcohol use.  Start on Unasyn for now.  Check urinary antigens.  Check HIV. PCCM consulted to evaluate need for bronchoscopy. 3. Alcohol use.  Reports that he has not had any alcohol in approximately 2 weeks.  He is not tremulous at this  point.  Monitor for any signs of withdrawal. 4. Hyponatremia.  Likely related to decreased p.o. intake and he may have some SIADH related to pneumonia.  Continue IV fluids. 5. Hypokalemia.  Replace.  DVT prophylaxis: lovenox  Code Status: full code  Family Communication: discussed with patient  Disposition Plan: discharge home once pneumonia has improved  Consults called: infectious disease, Dr. Drucilla Schmidt, PCCM  Admission status: inpatient, progressive care  Severity of Illness: The appropriate patient status for this patient is INPATIENT. Inpatient status is judged to be reasonable and necessary in order to provide the required intensity of service to ensure the patient's safety. The patient's presenting symptoms, physical exam findings, and initial radiographic and laboratory data in the context of their chronic comorbidities is felt to place them at high risk for further clinical deterioration. Furthermore, it is not anticipated that the patient will be medically stable for discharge from the hospital within 2 midnights of admission. The following factors support the patient status of inpatient.    "           The patient's presenting symptoms include  cough, fever, back pain "           The worrisome physical exam findings include  tachycardia, diaphoresis "           The initial radiographic and laboratory data are worrisome because of  extensive cavitary pneumonia on the right side, severe leukocytosis, hyponatremia "           The chronic co-morbidities include  none     * I certify that at the point of admission it is my clinical judgment that the patient will require inpatient hospital care spanning beyond 2 midnights from the point of admission due to high intensity of service, high risk for further deterioration and high frequency of surveillance required.Kathie Dike MD [facility]   If 7PM-7AM, please contact night-coverage [email or web address]   03/28/2020, 8:34 AM

## 2020-03-28 NOTE — ED Notes (Signed)
Pt not answer x3

## 2020-03-28 NOTE — ED Notes (Signed)
Noted pt more tachycardic than previously (135bpm from 120bpm), rechecked oral temp, febrile. Tylenol provided, see MAR

## 2020-03-28 NOTE — Consult Note (Signed)
[facility] for Infectious Disease    Date of Admission:  03/27/2020     Total days of antibiotics 2               Reason for Consult: Pneumonia / Concern for TB   Referring Provider: [facility] Primary Care Provider: Patient, No Pcp Per   ASSESSMENT:  Mr. Fults is a 39 y/o male with cavitary pneumonia. He does not appear to have significant risk factors for TB although recommend collecting 3 separate sputums to rule out. He will need to remain on airborne precautions for this. He does have poor dentition which is most likely the source of his pneumonia. Will check orthopantogram and agree with choice of Unasyn for antimicrobial therapy. Blood cultures and sputum cultures have been obtained and are pending.   PLAN:  1. Agree with Unasyn. 2. Check sputum cultures x 3 for AFB 3. Airborne precautions 4. Orthopantogram to evaluate for dental disease.   Active Problems:   Necrotizing pneumonia (HCC)   Sepsis ([place])   Alcohol use   Cannabis abuse   Hyponatremia   Hypokalemia   . enoxaparin (LOVENOX) injection  40 mg Subcutaneous Q24H  . guaiFENesin  600 mg Oral BID  . ipratropium-albuterol  3 mL Nebulization Q6H  . potassium chloride  40 mEq Oral Once     HPI: MORELL MEARS is a 39 y.o. male with no significant previous medical history presented with 2 week history of productive cough.  Mr. Niebuhr has had a productive cough with yellow colored sputum and temperatures between 100-101 F going on over the past week. CT angio chest with no pulmonary embolism and large scale cavitation of the right lung with multifocal ground glass opacity in the right upper and middle lobes indicating necrotizing pneumonia. Has been febrile since arrival to the ED with most recent temperature of 101 and leukocytosis of 28.8.  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 He has not traveled outside of the country. No exposure to anyone with TB that he is aware of. He has spent time in jail for 2 days when  he was younger but nothing recent. He consumes alcohol on a regular basis but not recently. No history of weight loss or night sweats.  Currently on airborne precautions for possibility of TB. Sars-CoV-2 testing is negating. Urinary antigens are pending.   Review of Systems: Review of Systems  Constitutional: Positive for fever. Negative for chills and weight loss.  Respiratory: Positive for cough and sputum production. Negative for shortness of breath and wheezing.   Cardiovascular: Negative for chest pain and leg swelling.  Gastrointestinal: Negative for abdominal pain, constipation, diarrhea, nausea and vomiting.  Skin: Negative for rash.     History reviewed. No pertinent past medical history.  Social History   Tobacco Use  . Smoking status: Former Research scientist (life sciences)  . Smokeless tobacco: Never Used  Substance Use Topics  . Alcohol use: Yes  . Drug use: No    Family History  Problem Relation Age of Onset  . Healthy Mother     No Known Allergies  OBJECTIVE: Blood pressure 127/76, pulse (!) 129, temperature 97.9 F (36.6 C), temperature source Oral, resp. rate (!) 28, height 5\' 8"  (1.727 m), weight 65.8 kg, SpO2 97 %.  Physical Exam Constitutional:      General: He is not in acute distress.    Appearance: He is well-developed.  Cardiovascular:     Rate and Rhythm: Regular rhythm. Tachycardia present.  Heart sounds: Normal heart sounds.  Pulmonary:     Effort: Pulmonary effort is normal.     Breath sounds: Rhonchi present.  Skin:    General: Skin is warm and dry.  Neurological:     Mental Status: He is alert and oriented to person, place, and time.  Psychiatric:        Behavior: Behavior normal.        Thought Content: Thought content normal.        Judgment: Judgment normal.     Lab Results Lab Results  Component Value Date   WBC 31.6 (H) 03/27/2020   HGB 9.9 (L) 03/27/2020   HCT 30.7 (L) 03/27/2020   MCV 84.6 03/27/2020   PLT 622 (H) 03/27/2020    Lab  Results  Component Value Date   CREATININE 0.96 03/27/2020   BUN 11 03/27/2020   NA 130 (L) 03/27/2020   K 3.3 (L) 03/27/2020   CL 87 (L) 03/27/2020   CO2 29 03/27/2020    Lab Results  Component Value Date   ALT 57 (H) 03/27/2020   AST 30 03/27/2020   ALKPHOS 103 03/27/2020   BILITOT 1.9 (H) 03/27/2020     Microbiology: Recent Results (from the past 240 hour(s))  SARS Coronavirus 2 by RT PCR (hospital order, performed in [facility] hospital lab) Nasopharyngeal Nasopharyngeal Swab     Status: None   Collection Time: 03/28/20  3:37 AM   Specimen: Nasopharyngeal Swab  Result Value Ref Range Status   SARS Coronavirus 2 NEGATIVE NEGATIVE Final    Comment: (NOTE) SARS-CoV-2 target nucleic acids are NOT DETECTED.  The SARS-CoV-2 RNA is generally detectable in upper and lower respiratory specimens during the acute phase of infection. The lowest concentration of SARS-CoV-2 viral copies this assay can detect is 250 copies / mL. A negative result does not preclude SARS-CoV-2 infection and should not be used as the sole basis for treatment or other patient management decisions.  A negative result may occur with improper specimen collection / handling, submission of specimen other than nasopharyngeal swab, presence of viral mutation(s) within the areas targeted by this assay, and inadequate number of viral copies (<250 copies / mL). A negative result must be combined with clinical observations, patient history, and epidemiological information.  Fact Sheet for Patients:   StrictlyIdeas.no  Fact Sheet for Healthcare Providers: BankingDealers.co.za  This test is not yet approved or  cleared by the [place] FDA and has been authorized for detection and/or diagnosis of SARS-CoV-2 by FDA under an Emergency Use Authorization (EUA).  This EUA will remain in effect (meaning this test can be used) for the duration of the COVID-19  declaration under Section 564(b)(1) of the Act, 21 U.S.C. section 360bbb-3(b)(1), unless the authorization is terminated or revoked sooner.  Performed at [facility], [place] [address], [place], [place] [address]      Terri Piedra, [place] [facility]  03/28/2020  8:56 AM

## 2020-03-28 NOTE — ED Notes (Signed)
Pt located in ED lobby.

## 2020-03-28 NOTE — ED Notes (Signed)
Pt transported to xray 

## 2020-03-28 NOTE — Sepsis Progress Note (Signed)
Per shift report, blood cultures drawn after initiation of antibiotics, but no reasoning given for this timeline.

## 2020-03-28 NOTE — Progress Notes (Signed)
Notified provider of need to order lactic acid.

## 2020-03-28 NOTE — Plan of Care (Signed)
New care plan added 

## 2020-03-28 NOTE — ED Notes (Signed)
Attempted to call pt in regards to lab results and EDP recommendations, no answer.

## 2020-03-29 LAB — COMPREHENSIVE METABOLIC PANEL
ALT: 47 U/L — ABNORMAL HIGH (ref 0–44)
AST: 36 U/L (ref 15–41)
Albumin: 1.6 g/dL — ABNORMAL LOW (ref 3.5–5.0)
Alkaline Phosphatase: 66 U/L (ref 38–126)
Anion gap: 11 (ref 5–15)
BUN: 6 mg/dL (ref 6–20)
CO2: 29 mmol/L (ref 22–32)
Calcium: 7.9 mg/dL — ABNORMAL LOW (ref 8.9–10.3)
Chloride: 95 mmol/L — ABNORMAL LOW (ref 98–111)
Creatinine, Ser: 0.77 mg/dL (ref 0.61–1.24)
GFR calc Af Amer: >60 mL/min (ref >60)
GFR calc non Af Amer: >60 mL/min (ref >60)
Glucose, Bld: 116 mg/dL — ABNORMAL HIGH (ref 70–99)
Potassium: 3.2 mmol/L — ABNORMAL LOW (ref 3.5–5.1)
Sodium: 135 mmol/L (ref 135–145)
Total Bilirubin: 1.9 mg/dL — ABNORMAL HIGH (ref 0.3–1.2)
Total Protein: 6.2 g/dL — ABNORMAL LOW (ref 6.5–8.1)

## 2020-03-29 LAB — MAGNESIUM: Magnesium: 2 mg/dL (ref 1.7–2.4)

## 2020-03-29 LAB — CBC WITH DIFFERENTIAL/PLATELET
Abs Immature Granulocytes: 0.31 10*3/uL — ABNORMAL HIGH (ref 0.00–0.07)
Basophils Absolute: 0 10*3/uL (ref 0.0–0.1)
Basophils Relative: 0 %
Eosinophils Absolute: 0 10*3/uL (ref 0.0–0.5)
Eosinophils Relative: 0 %
HCT: 23.8 % — ABNORMAL LOW (ref 39.0–52.0)
Hemoglobin: 7.8 g/dL — ABNORMAL LOW (ref 13.0–17.0)
Immature Granulocytes: 1 %
Lymphocytes Relative: 15 %
Lymphs Abs: 3.8 10*3/uL (ref 0.7–4.0)
MCH: 27.1 pg (ref 26.0–34.0)
MCHC: 32.8 g/dL (ref 30.0–36.0)
MCV: 82.6 fL (ref 80.0–100.0)
Monocytes Absolute: 2.6 10*3/uL — ABNORMAL HIGH (ref 0.1–1.0)
Monocytes Relative: 10 %
Neutro Abs: 18.1 10*3/uL — ABNORMAL HIGH (ref 1.7–7.7)
Neutrophils Relative %: 74 %
Platelets: 673 10*3/uL — ABNORMAL HIGH (ref 150–400)
RBC: 2.88 MIL/uL — ABNORMAL LOW (ref 4.22–5.81)
RDW: 13.4 % (ref 11.5–15.5)
WBC: 24.8 10*3/uL — ABNORMAL HIGH (ref 4.0–10.5)
nRBC: 0 % (ref 0.0–0.2)

## 2020-03-29 LAB — EXPECTORATED SPUTUM ASSESSMENT W GRAM STAIN, RFLX TO RESP C

## 2020-03-29 MED ORDER — LORAZEPAM 1 MG PO TABS
1.0000 mg | ORAL_TABLET | ORAL | Status: DC | PRN
  Administered 2020-03-29: 2 mg via ORAL
  Filled 2020-03-29: qty 2

## 2020-03-29 MED ORDER — MENTHOL 3 MG MT LOZG
1.0000 | LOZENGE | OROMUCOSAL | Status: DC | PRN
  Administered 2020-04-01 (×2): 3 mg via ORAL
  Filled 2020-03-29 (×3): qty 9

## 2020-03-29 MED ORDER — LORAZEPAM 2 MG/ML IJ SOLN: 1.0000 mg | INTRAMUSCULAR | Status: DC | PRN

## 2020-03-29 MED ORDER — ADULT MULTIVITAMIN W/MINERALS CH
1.0000 | ORAL_TABLET | Freq: Every day | ORAL | Status: DC
  Administered 2020-03-29 – 2020-03-30 (×2): 1 via ORAL
  Filled 2020-03-29 (×2): qty 1

## 2020-03-29 MED ORDER — POTASSIUM CHLORIDE CRYS ER 20 MEQ PO TBCR
60.0000 meq | EXTENDED_RELEASE_TABLET | Freq: Once | ORAL | Status: AC
  Administered 2020-03-29: 60 meq via ORAL
  Filled 2020-03-29: qty 3

## 2020-03-29 MED ORDER — THIAMINE HCL 100 MG/ML IJ SOLN
100.0000 mg | Freq: Every day | INTRAMUSCULAR | Status: DC
  Administered 2020-03-30: 100 mg via INTRAVENOUS
  Filled 2020-03-29: qty 2

## 2020-03-29 MED ORDER — FOLIC ACID 1 MG PO TABS
1.0000 mg | ORAL_TABLET | Freq: Every day | ORAL | Status: DC
  Administered 2020-03-29 – 2020-04-02 (×5): 1 mg via ORAL
  Filled 2020-03-29 (×5): qty 1

## 2020-03-29 MED ORDER — POTASSIUM CHLORIDE CRYS ER 20 MEQ PO TBCR
40.0000 meq | EXTENDED_RELEASE_TABLET | Freq: Two times a day (BID) | ORAL | Status: DC
  Administered 2020-03-29: 40 meq via ORAL
  Filled 2020-03-29: qty 2

## 2020-03-29 MED ORDER — THIAMINE HCL 100 MG PO TABS
100.0000 mg | ORAL_TABLET | Freq: Every day | ORAL | Status: DC
  Administered 2020-03-29: 100 mg via ORAL
  Filled 2020-03-29: qty 1

## 2020-03-29 NOTE — ED Provider Notes (Signed)
[facility] [facility] Provider Note   CSN: [id number] Arrival date & time: 03/27/20  1942     History Chief Complaint  Patient presents with  . Back Pain  . Chest Pain    AYINDE SWIM is a 39 y.o. male.   Back Pain Pain location: right sided mid lateral back pain. Quality:  Stabbing and aching Radiates to:  Does not radiate Pain severity:  Moderate Onset quality:  Gradual Duration:  10 days Timing:  Constant Progression:  Worsening Chronicity:  New Relieved by:  None tried Worsened by:  Nothing Ineffective treatments:  None tried Associated symptoms: chest pain   Associated symptoms: no fever   Chest Pain Associated symptoms: back pain   Associated symptoms: no fever        History reviewed. No pertinent past medical history.  Patient Active Problem List   Diagnosis Date Noted  . Necrotizing pneumonia ([place]) 03/28/2020  . Sepsis ([place]) 03/28/2020  . Alcohol use 03/28/2020  . Cannabis abuse 03/28/2020  . Hyponatremia 03/28/2020  . Hypokalemia 03/28/2020    History reviewed. No pertinent surgical history.     Family History  Problem Relation Age of Onset  . Healthy Mother     Social History   Tobacco Use  . Smoking status: Former Research scientist (life sciences)  . Smokeless tobacco: Never Used  Substance Use Topics  . Alcohol use: Yes  . Drug use: No    Home Medications Prior to Admission medications   Not on File    Allergies    Patient has no known allergies.  Review of Systems   Review of Systems  Constitutional: Negative for fever.  Cardiovascular: Positive for chest pain.  Musculoskeletal: Positive for back pain.  All other systems reviewed and are negative.   Physical Exam Updated Vital Signs BP 135/74 (BP Location: Right Arm)   Pulse (!) 12   Temp 98.4 F (36.9 C) (Oral)   Resp (!) 26   Ht 5\' 8"  (1.727 m)   Wt 65.8 kg   SpO2 92%   BMI 22.05 kg/m   Physical Exam Vitals and nursing note reviewed.  Constitutional:       Appearance: He is well-developed.  HENT:     Head: Normocephalic and atraumatic.  Cardiovascular:     Rate and Rhythm: Tachycardia present.  Pulmonary:     Effort: Pulmonary effort is normal. No respiratory distress.     Breath sounds: Decreased breath sounds (diffuse) present.  Chest:     Chest wall: No mass, tenderness, crepitus or edema.  Abdominal:     General: There is no distension.  Musculoskeletal:        General: Normal range of motion.     Cervical back: Normal range of motion.  Skin:    General: Skin is warm and dry.  Neurological:     General: No focal deficit present.     Mental Status: He is alert.     ED Results / Procedures / Treatments   Labs (all labs ordered are listed, but only abnormal results are displayed) Labs Reviewed  CBC - Abnormal; Notable for the following components:      Result Value   WBC 31.6 (*)    RBC 3.63 (*)    Hemoglobin 9.9 (*)    HCT 30.7 (*)    Platelets 622 (*)    All other components within normal limits  COMPREHENSIVE METABOLIC PANEL - Abnormal; Notable for the following components:   Sodium 130 (*)  Potassium 3.3 (*)    Chloride 87 (*)    Glucose, Bld 163 (*)    Calcium 8.5 (*)    Albumin 2.3 (*)    ALT 57 (*)    Total Bilirubin 1.9 (*)    All other components within normal limits  D-DIMER, QUANTITATIVE (NOT AT [facility]) - Abnormal; Notable for the following components:   D-Dimer, Quant 3.76 (*)    All other components within normal limits  COMPREHENSIVE METABOLIC PANEL - Abnormal; Notable for the following components:   Sodium 134 (*)    Potassium 3.1 (*)    Chloride 93 (*)    Glucose, Bld 130 (*)    Calcium 8.0 (*)    Albumin 1.8 (*)    Total Bilirubin 1.9 (*)    All other components within normal limits  PROTIME-INR - Abnormal; Notable for the following components:   Prothrombin Time 15.8 (*)    INR 1.3 (*)    All other components within normal limits  APTT - Abnormal; Notable for the following components:    aPTT 43 (*)    All other components within normal limits  CBC WITH DIFFERENTIAL/PLATELET - Abnormal; Notable for the following components:   WBC 28.8 (*)    RBC 2.98 (*)    Hemoglobin 8.2 (*)    HCT 24.6 (*)    Platelets 646 (*)    Neutro Abs 23.3 (*)    Monocytes Absolute 2.3 (*)    All other components within normal limits  SARS CORONAVIRUS 2 BY RT PCR (HOSPITAL ORDER, [facility])  EXPECTORATED SPUTUM ASSESSMENT W REFEX TO RESP CULTURE  CULTURE, RESPIRATORY  CULTURE, BLOOD (SINGLE)  CULTURE, BLOOD (ROUTINE X 2)  CULTURE, BLOOD (ROUTINE X 2)  ACID FAST SMEAR (AFB, MYCOBACTERIA)  ACID FAST CULTURE WITH REFLEXED SENSITIVITIES (MYCOBACTERIA)  MRSA PCR SCREENING  LACTIC ACID, PLASMA  HIV ANTIBODY (ROUTINE TESTING W REFLEX)  LACTIC ACID, PLASMA  LACTIC ACID, PLASMA  CBC  LEGIONELLA PNEUMOPHILA SEROGP 1 UR AG  STREP PNEUMONIAE URINARY ANTIGEN  CBC WITH DIFFERENTIAL/PLATELET  COMPREHENSIVE METABOLIC PANEL  TROPONIN I (HIGH SENSITIVITY)  TROPONIN I (HIGH SENSITIVITY)    EKG None  Radiology DG Orthopantogram  Result Date: 03/28/2020 CLINICAL DATA:  Poor dentition EXAM: ORTHOPANTOGRAM/PANORAMIC COMPARISON:  None. FINDINGS: Mandible intact without fracture. Prominent dental carie of the posterior right mandibular molar as well as at the second maxillary molar on the left. Small radicular cysts are also noted at these locations. IMPRESSION: Odontogenic disease with prominent dental carie involving the posterior right mandibular molar as well as at the second maxillary molar on the left with associated radicular cysts. Electronically Signed   By: Davina Poke D.O.   On: 03/28/2020 13:11   CT Angio Chest PE W and/or Wo Contrast  Result Date: 03/28/2020 CLINICAL DATA:  Chest pain that worsens with breathing EXAM: CT ANGIOGRAPHY CHEST WITH CONTRAST TECHNIQUE: Multidetector CT imaging of the chest was performed using the standard protocol during bolus  administration of intravenous contrast. Multiplanar CT image reconstructions and MIPs were obtained to evaluate the vascular anatomy. CONTRAST:  157mL OMNIPAQUE IOHEXOL 350 MG/ML SOLN COMPARISON:  None. FINDINGS: Cardiovascular: Satisfactory opacification of the pulmonary arteries to the segmental level. No evidence of pulmonary embolism. Normal heart size. No pericardial effusion. Mediastinum/Nodes: No enlarged mediastinal, hilar, or axillary lymph nodes. Thyroid gland, trachea, and esophagus demonstrate no significant findings. Lungs/Pleura: There are biapical bullae. Much of the right lung is necrotic and filled with free air  and fluid. There is extensive ground-glass opacity in the right upper and middle lobes. The left lung is relatively normal aside from multiple medial bullae. Upper Abdomen: No acute abnormality. Musculoskeletal: No chest wall abnormality. No acute or significant osseous findings. Review of the MIP images confirms the above findings. IMPRESSION: 1. No pulmonary embolism. 2. Large scale cavitation of the right lung, with multifocal ground-glass opacity in the right upper and middle lobes. This likely indicates necrotizing pneumonia. 3. Biapical bullae. Electronically Signed   By: Ulyses Jarred M.D.   On: 03/28/2020 02:47    Procedures Procedures (including critical care time)  Medications Ordered in ED Medications  enoxaparin (LOVENOX) injection 40 mg (40 mg Subcutaneous Given 03/28/20 1018)  Ampicillin-Sulbactam (UNASYN) 3 g in sodium chloride 0.9 % 100 mL IVPB (3 g Intravenous New Bag/Given 03/28/20 2219)  guaiFENesin (MUCINEX) 12 hr tablet 600 mg (600 mg Oral Given 03/28/20 2157)  HYDROcodone-homatropine (HYCODAN) 5-1.5 MG/5ML syrup 5 mL (has no administration in time range)  lactated ringers infusion ( Intravenous New Bag/Given 03/28/20 2220)  acetaminophen (TYLENOL) tablet 650 mg (650 mg Oral Given 03/28/20 2250)  ipratropium-albuterol (DUONEB) 0.5-2.5 (3) MG/3ML nebulizer solution 3  mL (has no administration in time range)  ketorolac (TORADOL) 30 MG/ML injection 30 mg (has no administration in time range)  iohexol (OMNIPAQUE) 350 MG/ML injection 100 mL (100 mLs Intravenous Contrast Given 03/28/20 0226)  lactated ringers bolus 1,000 mL (0 mLs Intravenous Stopped 03/28/20 0923)  vancomycin (VANCOREADY) IVPB 1250 mg/250 mL (0 mg Intravenous Stopped 03/28/20 1003)  ceFEPIme (MAXIPIME) 2 g in sodium chloride 0.9 % 100 mL IVPB (0 g Intravenous Stopped 03/28/20 0749)  lactated ringers bolus 1,000 mL (0 mLs Intravenous Stopped 03/28/20 0748)    And  lactated ringers bolus 1,000 mL (0 mLs Intravenous Stopped 03/28/20 0749)  lactated ringers bolus 1,000 mL (0 mLs Intravenous Stopped 03/28/20 1249)  potassium chloride SA (KLOR-CON) CR tablet 40 mEq (40 mEq Oral Given 03/28/20 1017)    ED Course  I have reviewed the triage vital signs and the nursing notes.  Pertinent labs & imaging results that were available during my care of the patient were reviewed by me and considered in my medical decision making (see chart for details).    MDM Rules/Calculators/A&P                          Found to have a cavitary lesion in right right lung. Sepsis called. Abx/fluids started. LA ordered. Discussed with CCM/Pulm who recommended admission. Discussed with medicine for same.   Final Clinical Impression(s) / ED Diagnoses Final diagnoses:  Necrotizing pneumonia [facility])    Rx / DC Orders ED Discharge Orders    None       Arnita Koons, Corene Cornea, MD 03/29/20 [phone]

## 2020-03-29 NOTE — Social Work (Signed)
CSW consulted for SA resources. CSW met with patient bedside and introduced self and role. CSW asked patient about his alcohol and marijuana use. Patient stated he stopped drinking a couple of weeks ago and does not plan on drinking anymore. In reference to his marijuana use, patient expressed he does not need resources to quit. Patient declined taking any community resources. No other needs expressed.  Criss Alvine, [place] Social Worker

## 2020-03-29 NOTE — Progress Notes (Signed)
Patient ID: Andrew [name], male   DOB: December 12, 1980, 39 y.o.   MRN: [id number]          [facility]    Date of Admission:  03/27/2020   Day 2 ampicillin sulbactam         His temperature has come down today on empiric ampicillin sulbactam for possible lung abscess versus superinfected bullae.  His sputum Gram stain shows mixed bacterial flora compatible with lung abscess.  AFB smears are pending.  Blood cultures remain negative.  His HIV antibody is negative.  I do not recommend any changes at this time.         Michel Bickers, MD [facility] [phone] pager   [phone] cell 03/29/2020, 2:03 PM

## 2020-03-29 NOTE — Progress Notes (Signed)
PROGRESS NOTE    Andrew [name]  OFB:[id number] DOB: [birth year] DOA: 03/27/2020 PCP: Patient, No Pcp Per      Brief Narrative:  Andrew [name] is a 39 y.o. M with no significant PMHx who presented with about a week of chest/back pain and cough.  This progressed to fever, so he came to the ER, where CT imaging showed a large cavitary pneumonia.  Pulmonology and ID were consulted.       Assessment & Plan:  Sepsis from cavitary source Patient presented with fever, leukocytosis.  Pneumonia on imaging with cavitation.  ID suspect an odontogenic source.  Orthopantogram with caries only.  No bronch needed. -Continue Unasyn -Consult ID and pulmonology, appreciate recommendations -Rule out TB -Daily AFB for 2 more days -Pulmonary toilet -Airborne precautions   Hyponatremia Mild, asymptomatic, stable  Hypokalemia -Replete K -Check Mag  Sinus tachycardia Did not respond to fluids, I do not think this is solely dehydration or sepsis related.  Given his dentition and reported alcohol use, suspect this may be some alcohol or drug withdrawal -Start CIWA protocol -Start thiamine and folate  Anemia No clinical bleeding, probably from chronic disease -Check iron stores       Disposition: Status is: Inpatient  Remains inpatient appropriate because:Still tachycardic to 130, still need to rule out TB   Dispo: The patient is from: Home              Anticipated d/c is to: Home              Anticipated d/c date is: 2 days              Patient currently is not medically stable to d/c.              MDM: The below labs and imaging reports were reviewed and summarized above.  Medication management as above.  This is a severe acute illness that poses a threat to life bodily function   DVT prophylaxis: enoxaparin (LOVENOX) injection 40 mg Start: 03/28/20 1000  Code Status: Full code Family Communication:     Consultants:   Pulmonology  Infectious  disease  Procedures:     Antimicrobials:   Unasyn 8/6 >>   Culture data:   8/6 blood culture x2 -- no growth  8/6 sputum culture -- gram stain positive  8.6 AFB smear -- negative  8/7 AFB smear -- pending  8/8 AFB smear pending            Subjective: Patient feeling some improvement with antibiotics./Purulent sputum, he has not as much chest pain/back pain.  No dizziness with standing, no fever or confusion.  Objective: Vitals:   03/28/20 1752 03/28/20 1845 03/28/20 2145 03/29/20 1300  BP:  134/70 135/74 128/70  Pulse:   (!) 12 (!) 131  Resp: (!) 26   12  Temp:  99 F (37.2 C) 98.4 F (36.9 C) 98.2 F (36.8 C)  TempSrc:   Oral Oral  SpO2:   92% 92%  Weight:      Height:        Intake/Output Summary (Last 24 hours) at 03/29/2020 1642 Last data filed at 03/29/2020 0302 Gross per 24 hour  Intake 1488.99 ml  Output --  Net 1488.99 ml   Filed Weights   03/27/20 2044  Weight: 65.8 kg    Examination: General appearance: Well-nourished adult male, alert and in no obvious distress.   HEENT: Anicteric, conjunctiva pink, lids and lashes normal. No nasal deformity, discharge,  epistaxis.  Lips moist, some cracked teeth, relatively poor dentition, but not bad overall, oropharynx moist, no oral lesions, torusthe roof of mouth, hearing normal.   Skin: Warm and dry.  No jaundice.  No suspicious rashes or lesions. Cardiac: Tachycardic, regular, nl S1-S2, no murmurs appreciated.  Capillary refill is brisk.  JVP normal  No LE edema.  Radial  pulses 2+ and symmetric. Respiratory: Normal respiratory rate and rhythm.  Breath sounds absent on the right, lung sounds normal without rales or wheezes on the left Abdomen: Abdomen soft.  No TTP or guarding. No ascites, distension, hepatosplenomegaly.   MSK: No deformities or effusions. Neuro: Awake and alert.  EOMI, moves all extremities. Speech fluent.    Psych: Sensorium intact and responding to questions, attention normal.  Affect normal.  Judgment and insight appear normal.    Data Reviewed: I have personally reviewed following labs and imaging studies:  CBC: Recent Labs  Lab 03/27/20 2048 03/28/20 0909 03/29/20 0135  WBC 31.6* 28.8* 24.8*  NEUTROABS  --  23.3* 18.1*  HGB 9.9* 8.2* 7.8*  HCT 30.7* 24.6* 23.8*  MCV 84.6 82.6 82.6  PLT 622* 646* 932*   Basic Metabolic Panel: Recent Labs  Lab 03/27/20 2048 03/28/20 0909 03/29/20 0135  NA 130* 134* 135  K 3.3* 3.1* 3.2*  CL 87* 93* 95*  CO2 29 30 29   GLUCOSE 163* 130* 116*  BUN 11 7 6   CREATININE 0.96 0.73 0.77  CALCIUM 8.5* 8.0* 7.9*  MG  --   --  2.0   GFR: Estimated Creatinine Clearance: 115.4 mL/min (by C-G formula based on SCr of 0.77 mg/dL). Liver Function Tests: Recent Labs  Lab 03/27/20 2048 03/28/20 0909 03/29/20 0135  AST 30 24 36  ALT 57* 44 47*  ALKPHOS 103 75 66  BILITOT 1.9* 1.9* 1.9*  PROT 7.7 6.5 6.2*  ALBUMIN 2.3* 1.8* 1.6*   No results for input(s): LIPASE, AMYLASE in the last 168 hours. No results for input(s): AMMONIA in the last 168 hours. Coagulation Profile: Recent Labs  Lab 03/28/20 0909  INR 1.3*   Cardiac Enzymes: No results for input(s): CKTOTAL, CKMB, CKMBINDEX, TROPONINI in the last 168 hours. BNP (last 3 results) No results for input(s): PROBNP in the last 8760 hours. HbA1C: No results for input(s): HGBA1C in the last 72 hours. CBG: No results for input(s): GLUCAP in the last 168 hours. Lipid Profile: No results for input(s): CHOL, HDL, LDLCALC, TRIG, CHOLHDL, LDLDIRECT in the last 72 hours. Thyroid Function Tests: No results for input(s): TSH, T4TOTAL, FREET4, T3FREE, THYROIDAB in the last 72 hours. Anemia Panel: No results for input(s): VITAMINB12, FOLATE, FERRITIN, TIBC, IRON, RETICCTPCT in the last 72 hours. Urine analysis: No results found for: COLORURINE, APPEARANCEUR, LABSPEC, PHURINE, GLUCOSEU, HGBUR, BILIRUBINUR, KETONESUR, PROTEINUR, UROBILINOGEN, NITRITE, LEUKOCYTESUR Sepsis  Labs: @LABRCNTIP (procalcitonin:4,lacticacidven:4)  ) Recent Results (from the past 240 hour(s))  SARS Coronavirus 2 by RT PCR (hospital order, performed in [facility] hospital lab) Nasopharyngeal Nasopharyngeal Swab     Status: None   Collection Time: 03/28/20  3:37 AM   Specimen: Nasopharyngeal Swab  Result Value Ref Range Status   SARS Coronavirus 2 NEGATIVE NEGATIVE Final    Comment: (NOTE) SARS-CoV-2 target nucleic acids are NOT DETECTED.  The SARS-CoV-2 RNA is generally detectable in upper and lower respiratory specimens during the acute phase of infection. The lowest concentration of SARS-CoV-2 viral copies this assay can detect is 250 copies / mL. A negative result does not preclude SARS-CoV-2 infection and should  not be used as the sole basis for treatment or other patient management decisions.  A negative result may occur with improper specimen collection / handling, submission of specimen other than nasopharyngeal swab, presence of viral mutation(s) within the areas targeted by this assay, and inadequate number of viral copies (<250 copies / mL). A negative result must be combined with clinical observations, patient history, and epidemiological information.  Fact Sheet for Patients:   StrictlyIdeas.no  Fact Sheet for Healthcare Providers: BankingDealers.co.za  This test is not yet approved or  cleared by the [place] FDA and has been authorized for detection and/or diagnosis of SARS-CoV-2 by FDA under an Emergency Use Authorization (EUA).  This EUA will remain in effect (meaning this test can be used) for the duration of the COVID-19 declaration under Section 564(b)(1) of the Act, 21 U.S.C. section 360bbb-3(b)(1), unless the authorization is terminated or revoked sooner.  Performed at [facility], [place] [address], [place], [place] [address]   Culture, blood (single)     Status: None (Preliminary result)    Collection Time: 03/28/20  5:57 AM   Specimen: BLOOD  Result Value Ref Range Status   Specimen Description BLOOD RIGHT ANTECUBITAL  Final   Special Requests   Final    BOTTLES DRAWN AEROBIC AND ANAEROBIC Blood Culture results may not be optimal due to an inadequate volume of blood received in culture bottles   Culture   Final    NO GROWTH 1 DAY Performed at [facility], [place] [address] [place], [place] [address]    Report Status PENDING  Incomplete  Culture, blood (x 2)     Status: None (Preliminary result)   Collection Time: 03/28/20  9:09 AM   Specimen: BLOOD LEFT HAND  Result Value Ref Range Status   Specimen Description BLOOD LEFT HAND  Final   Special Requests   Final    BOTTLES DRAWN AEROBIC AND ANAEROBIC Blood Culture adequate volume   Culture   Final    NO GROWTH < 24 HOURS Performed at [facility], [place] [address] [place], [place] [address]    Report Status PENDING  Incomplete  Culture, blood (x 2)     Status: None (Preliminary result)   Collection Time: 03/28/20 10:00 AM   Specimen: BLOOD  Result Value Ref Range Status   Specimen Description BLOOD RIGHT ANTECUBITAL  Final   Special Requests   Final    BOTTLES DRAWN AEROBIC AND ANAEROBIC Blood Culture adequate volume   Culture   Final    NO GROWTH < 24 HOURS Performed at [facility], [place] [address] [place], [place] [address]    Report Status PENDING  Incomplete  Expectorated sputum assessment w rflx to resp cult     Status: None   Collection Time: 03/28/20 10:15 AM   Specimen: Expectorated Sputum  Result Value Ref Range Status   Specimen Description EXPECTORATED SPUTUM  Final   Special Requests NONE  Final   Sputum evaluation   Final    THIS SPECIMEN IS ACCEPTABLE FOR SPUTUM CULTURE Performed at [facility], [place] [address] [place],  [address]    Report Status 03/29/2020 FINAL  Final  Culture, respiratory     Status: None (Preliminary result)   Collection Time: 03/28/20 10:15  AM  Result Value Ref Range Status   Specimen Description EXPECTORATED SPUTUM  Final   Special Requests NONE Reflexed from [id number]  Final   Gram Stain  Final    ABUNDANT WBC PRESENT,BOTH PMN AND MONONUCLEAR ABUNDANT GRAM POSITIVE COCCI MODERATE GRAM VARIABLE ROD FEW GRAM NEGATIVE RODS    Culture   Final    CULTURE REINCUBATED FOR BETTER GROWTH Performed at [facility], [address], [place], [place] [address]    Report Status PENDING  Incomplete         Radiology Studies: DG Orthopantogram  Result Date: 03/28/2020 CLINICAL DATA:  Poor dentition EXAM: ORTHOPANTOGRAM/PANORAMIC COMPARISON:  None. FINDINGS: Mandible intact without fracture. Prominent dental carie of the posterior right mandibular molar as well as at the second maxillary molar on the left. Small radicular cysts are also noted at these locations. IMPRESSION: Odontogenic disease with prominent dental carie involving the posterior right mandibular molar as well as at the second maxillary molar on the left with associated radicular cysts. Electronically Signed   By: Davina Poke D.O.   On: 03/28/2020 13:11   CT Angio Chest PE W and/or Wo Contrast  Result Date: 03/28/2020 CLINICAL DATA:  Chest pain that worsens with breathing EXAM: CT ANGIOGRAPHY CHEST WITH CONTRAST TECHNIQUE: Multidetector CT imaging of the chest was performed using the standard protocol during bolus administration of intravenous contrast. Multiplanar CT image reconstructions and MIPs were obtained to evaluate the vascular anatomy. CONTRAST:  120mL OMNIPAQUE IOHEXOL 350 MG/ML SOLN COMPARISON:  None. FINDINGS: Cardiovascular: Satisfactory opacification of the pulmonary arteries to the segmental level. No evidence of pulmonary embolism. Normal heart size. No pericardial effusion. Mediastinum/Nodes: No enlarged mediastinal, hilar, or axillary lymph nodes. Thyroid gland, trachea, and esophagus demonstrate no significant findings. Lungs/Pleura: There are  biapical bullae. Much of the right lung is necrotic and filled with free air and fluid. There is extensive ground-glass opacity in the right upper and middle lobes. The left lung is relatively normal aside from multiple medial bullae. Upper Abdomen: No acute abnormality. Musculoskeletal: No chest wall abnormality. No acute or significant osseous findings. Review of the MIP images confirms the above findings. IMPRESSION: 1. No pulmonary embolism. 2. Large scale cavitation of the right lung, with multifocal ground-glass opacity in the right upper and middle lobes. This likely indicates necrotizing pneumonia. 3. Biapical bullae. Electronically Signed   By: Ulyses Jarred M.D.   On: 03/28/2020 02:47        Scheduled Meds: . enoxaparin (LOVENOX) injection  40 mg Subcutaneous Q24H  . folic acid  1 mg Oral Daily  . guaiFENesin  600 mg Oral BID  . ketorolac  30 mg Intravenous Once  . multivitamin with minerals  1 tablet Oral Daily  . thiamine  100 mg Oral Daily   Or  . thiamine  100 mg Intravenous Daily   Continuous Infusions: . ampicillin-sulbactam (UNASYN) IV 3 g (03/29/20 1502)     LOS: 1 day    Time spent: [place], MD [facility] 03/29/2020, 4:42 PM     Please page though [place] or Epic secure chat:  For [employer], Adult nurse

## 2020-03-29 NOTE — Consult Note (Addendum)
NAME:  Andrew [name], MRN:  [id number], DOB:  1981-08-15, LOS: 1 ADMISSION DATE:  03/27/2020, CONSULTATION DATE:  03/29/20 REFERRING MD:  Edwin Dada, *, CHIEF COMPLAINT:  Chest pain, cough  Brief History   39 year old man with no PMH presented with several days of left upper chest pain and cough found to have cavitary pneumonia on CT scan whom we are consulted for treatment recommendations.   History of present illness   Patient is healthy man. Was in usual state of health last week when traveled to [place] to visit sister. Late in the trip this weekend he noted left upper chest pain. He thought it was related to his job where he [employer] and does a lot of lifting of heavy objects. Pain not worse with position or movement. Not worse with breathing in or out.  No exertional chest pain.  Nothing makes pain better. Pain is described as mild to moderate.   Shortly after developing chest pain patient reports onset of cough.  Productive in nature.  Phlegm is yellow.  Cough has been present last several days. Cough described as moderate. Nothing seems to make cough worse. Nothing makes the cough better.   Patient reports low-grade fever at home the last couple of days.  As high as around 100-100.5.  Has felt warm and diaphoretic.  No drenching night sweats.  Has not taken any medication to try to break the fever.  Patient in the cigarettes.  Endorses significant marijuana smoking in the past, 3 to 4 cigars filled with marijuana a day.  He is cut back more recently.  Denies any other inhalational exposure or drug use.  Does not snort drugs, does not use IV drugs.  He has never been homeless.  He has never been incarcerated.  No family members have been ill and he is aware of no family members that have had or been treated for TB.  He denies exposure to HIV.  He denies history of reflux or nausea with vomiting around the time of symptom onset.  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  Onset the last couple of days.  He thinks related to chest pain and cough described above.  Had to go to the truck and rest.  Given he could no longer work, this prompted him to present to the emergency room for further evaluation.  In the ED, initial vitals notable for normal blood pressure, and normal respiratory rate along with tachycardia to 130.  Throughout his stay in the ED his blood pressures have been normotensive with ongoing significant sinus tachycardia and development of fever to 101 F and tachypnea.  Initial labs notable for hyponatremia 130, hypokalemia 3.3, T bili elevated at 1.9 with hypoalbuminemia.  CBC notable for anemia with hemoglobin 9.9 and leukocytosis to 31,000 as well as likely reactive f thrombocytosis of 622.  D-dimer was elevated which prompted CTA chest that demonstrated no PE but bilateral peripheral and paramediastinal cystic emphysematous changes and extensive right upper lobe consolidation with appearance of air-fluid levels on my interpretation consistent with pneumonia and superinfected pulmonary bullae.  He was given vancomycin and cefepime.  He was admitted to the hospitalist.  Per review of H&P hospitalist discussed case with ID and AFB sputum's as well as lower respiratory culture was ordered.  Recommended Unasyn for IV antimicrobial coverage.  Consultation with pulmonary was recommended for consideration of bronchoscopy.  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, does not smoke cigarettes, had been smoking 3 to 4 cigars filled with marijuana daily although he reports he is cut back in recent months, has 2-4 drinks a few nights a  week  Consults:  Infectious disease  Procedures:  N/A  Significant Diagnostic Tests:  03/28/2020 CTA chest PE protocol: Bilateral peripheral paramediastinal cystic emphysematous changes and extensive consolidation of right upper lobe with what appear to be superinfected bullae versus cavitary pneumonia with air-fluid levels  Micro Data:  Pending  Antimicrobials:  Status post 1 dose vancomycin and cefepime, Unasyn ordered  Interim history/subjective:  Still tachy but stable, WBC trending down. Patient feels similar to day of admission.  Objective   Blood pressure 135/74, pulse (!) 12, temperature 98.4 F (36.9 C), temperature source Oral, resp. rate (!) 26, height 5\' 8"  (1.727 m), weight 65.8 kg, SpO2 92 %.        Intake/Output Summary (Last 24 hours) at 03/29/2020 1157 Last data filed at 03/29/2020 0302 Gross per 24 hour  Intake 1488.99 ml  Output 575 ml  Net 913.99 ml   Filed Weights   03/27/20 2044  Weight: 65.8 kg    Examination: General: Well-built, mildly diaphoretic, in no acute distress Eyes: EOMI, slight jaundice ENT: Several cavities noted bilaterally particularly molars with some missing molars, no lesions Lungs: Diminished right upper lobe, distant breath sounds otherwise bilaterally, no wheezing Cardiovascular: Tachycardic, no murmurs appreciated Abdomen: Soft, nontender, bowel sounds present Extremities: Warm, Neuro: Moving all extremities freely, no weakness, cranial nerves intact Psych: Normal mood, full affect    Assessment & Plan:  39 year old man with no PMH presented with several days of left upper chest pain and cough found to have cavitary pneumonia on CT scan whom we are consulted for treatment recommendations and consideration of bronchoscopy.   Cavitary pneumonia: Favor cavitary lesions to represent superinfected bullae given significant emphysematous changes on CT scan but cannot rule out necrotizing pneumonia.  Could be community-acquired bug  or result of aspiration (posterior right upper lobe is common for supine aspiration). These infections are near universally polymicrobial and involve anaerobes. Poor dentition is additional risk factor for anaerobic infection. Patient is producing sputum and lower respiratory culture should be obtained although would caution against treating single bacteria if isolated.  Respiratory culture culture may reveal resistant bug  not currently being treated.  Patient has no current identifiable risk factors for TB, HIV test pending. AFB sputume ordered. --Agree Unasyn for polymicrobial and anaerobic coverage --Patient will require  6 weeks of antibiotic therapy for cavitary lesions with subsequent imaging to demonstrate improvement/resolution, once deemed medically appropriate oral Augmentin would be reasonable choice at discharge --Will have hospital follow up in pulm clinic  --No role for bronchoscopy at this time  We will sign off. Please contact us with any questions or concerns.  Labs   Labs personally reviewed and as per EMR CBC: Recent Labs  Lab 03/27/20 2048 03/28/20 0909 03/29/20 0135  WBC 31.6* 28.8* 24.8*  NEUTROABS  --  23.3* 18.1*  HGB 9.9* 8.2* 7.8*  HCT 30.7* 24.6* 23.8*  MCV 84.6 82.6 82.6  PLT 622* 646* 673*    Basic Metabolic Panel: Recent Labs  Lab 03/27/20 2048 03/28/20 0909 03/29/20 0135  NA 130* 134* 135  K 3.3* 3.1* 3.2*  CL 87* 93* 95*  CO2 29 30 29   GLUCOSE 163* 130* 116*  BUN 11 7 6   CREATININE 0.96 0.73 0.77  CALCIUM 8.5* 8.0* 7.9*  MG  --   --  2.0   GFR: Estimated Creatinine Clearance: 115.4 mL/min (by C-G formula based on SCr of 0.77 mg/dL). Recent Labs  Lab 03/27/20 2048 03/28/20 0750 03/28/20 0909 03/28/20 1924 03/29/20 0135  WBC 31.6*  --  28.8*  --  24.8*  LATICACIDVEN  --  1.4 1.2 1.2  --     Liver Function Tests: Recent Labs  Lab 03/27/20 2048 03/28/20 0909 03/29/20 0135  AST 30 24 36  ALT 57* 44 47*  ALKPHOS 103 75 66   BILITOT 1.9* 1.9* 1.9*  PROT 7.7 6.5 6.2*  ALBUMIN 2.3* 1.8* 1.6*    Coagulation Profile: Recent Labs  Lab 03/28/20 0909  INR 1.3*     Review of Systems:   Denies any headache.  No significant weight loss.  No drenching night sweats.  No hemoptysis.  Comprehensive review of systems otherwise negative.  Allergies No Known Allergies   Home Medications  Prior to Admission medications   Not on File

## 2020-03-30 DIAGNOSIS — R7401 Elevation of levels of liver transaminase levels: Secondary | ICD-10-CM

## 2020-03-30 DIAGNOSIS — D509 Iron deficiency anemia, unspecified: Secondary | ICD-10-CM

## 2020-03-30 LAB — CBC
HCT: 21.2 % — ABNORMAL LOW (ref 39.0–52.0)
Hemoglobin: 6.8 g/dL — CL (ref 13.0–17.0)
MCH: 27.3 pg (ref 26.0–34.0)
MCHC: 32.1 g/dL (ref 30.0–36.0)
MCV: 85.1 fL (ref 80.0–100.0)
Platelets: 666 10*3/uL — ABNORMAL HIGH (ref 150–400)
RBC: 2.49 MIL/uL — ABNORMAL LOW (ref 4.22–5.81)
RDW: 13.9 % (ref 11.5–15.5)
WBC: 28.2 10*3/uL — ABNORMAL HIGH (ref 4.0–10.5)
nRBC: 0.1 % (ref 0.0–0.2)

## 2020-03-30 LAB — CULTURE, RESPIRATORY W GRAM STAIN: Culture: NORMAL

## 2020-03-30 LAB — FOLATE: Folate: 14.9 ng/mL (ref >5.9)

## 2020-03-30 LAB — ACID FAST SMEAR (AFB, MYCOBACTERIA): Acid Fast Smear: NEGATIVE

## 2020-03-30 LAB — BASIC METABOLIC PANEL
Anion gap: 11 (ref 5–15)
BUN: 7 mg/dL (ref 6–20)
CO2: 27 mmol/L (ref 22–32)
Calcium: 7.6 mg/dL — ABNORMAL LOW (ref 8.9–10.3)
Chloride: 95 mmol/L — ABNORMAL LOW (ref 98–111)
Creatinine, Ser: 0.89 mg/dL (ref 0.61–1.24)
GFR calc Af Amer: >60 mL/min (ref >60)
GFR calc non Af Amer: >60 mL/min (ref >60)
Glucose, Bld: 135 mg/dL — ABNORMAL HIGH (ref 70–99)
Potassium: 3.1 mmol/L — ABNORMAL LOW (ref 3.5–5.1)
Sodium: 133 mmol/L — ABNORMAL LOW (ref 135–145)

## 2020-03-30 LAB — NOVEL CORONAVIRUS, NAA: SARS-CoV-2, NAA: NOT DETECTED

## 2020-03-30 LAB — IRON AND TIBC
Iron: 11 ug/dL — ABNORMAL LOW (ref 45–182)
Saturation Ratios: 9 % — ABNORMAL LOW (ref 17.9–39.5)
TIBC: 120 ug/dL — ABNORMAL LOW (ref 250–450)
UIBC: 109 ug/dL

## 2020-03-30 LAB — HEMOGLOBIN AND HEMATOCRIT, BLOOD
HCT: 26 % — ABNORMAL LOW (ref 39.0–52.0)
Hemoglobin: 8.4 g/dL — ABNORMAL LOW (ref 13.0–17.0)

## 2020-03-30 LAB — SARS-COV-2, NAA 2 DAY TAT

## 2020-03-30 LAB — HEPATIC FUNCTION PANEL
ALT: 99 U/L — ABNORMAL HIGH (ref 0–44)
AST: 118 U/L — ABNORMAL HIGH (ref 15–41)
Albumin: 1.5 g/dL — ABNORMAL LOW (ref 3.5–5.0)
Alkaline Phosphatase: 65 U/L (ref 38–126)
Bilirubin, Direct: 0.6 mg/dL — ABNORMAL HIGH (ref 0.0–0.2)
Indirect Bilirubin: 1.1 mg/dL — ABNORMAL HIGH (ref 0.3–0.9)
Total Bilirubin: 1.7 mg/dL — ABNORMAL HIGH (ref 0.3–1.2)
Total Protein: 6.1 g/dL — ABNORMAL LOW (ref 6.5–8.1)

## 2020-03-30 LAB — RETICULOCYTES
Immature Retic Fract: 27.9 % — ABNORMAL HIGH (ref 2.3–15.9)
RBC.: 2.51 MIL/uL — ABNORMAL LOW (ref 4.22–5.81)
Retic Count, Absolute: 48.7 10*3/uL (ref 19.0–186.0)
Retic Ct Pct: 1.9 % (ref 0.4–3.1)

## 2020-03-30 LAB — LACTATE DEHYDROGENASE: LDH: 334 U/L — ABNORMAL HIGH (ref 98–192)

## 2020-03-30 LAB — VITAMIN B12: Vitamin B-12: 784 pg/mL (ref 180–914)

## 2020-03-30 LAB — ABO/RH: ABO/RH(D): B POS

## 2020-03-30 LAB — FERRITIN: Ferritin: 1474 ng/mL — ABNORMAL HIGH (ref 24–336)

## 2020-03-30 MED ORDER — SODIUM CHLORIDE 0.9% IV SOLUTION: Freq: Once | INTRAVENOUS | Status: AC

## 2020-03-30 MED ORDER — SODIUM CHLORIDE 0.9 % IV SOLN
510.0000 mg | Freq: Once | INTRAVENOUS | Status: AC
  Administered 2020-03-30: 510 mg via INTRAVENOUS
  Filled 2020-03-30: qty 17

## 2020-03-30 MED ORDER — ZOLPIDEM TARTRATE 5 MG PO TABS
5.0000 mg | ORAL_TABLET | Freq: Every evening | ORAL | Status: DC | PRN
  Administered 2020-03-30: 5 mg via ORAL
  Filled 2020-03-30: qty 1

## 2020-03-30 MED ORDER — BACID PO TABS
2.0000 | ORAL_TABLET | Freq: Three times a day (TID) | ORAL | Status: DC
  Administered 2020-03-30 – 2020-04-02 (×10): 2 via ORAL
  Filled 2020-03-30 (×11): qty 2

## 2020-03-30 MED ORDER — POTASSIUM CHLORIDE CRYS ER 20 MEQ PO TBCR
40.0000 meq | EXTENDED_RELEASE_TABLET | Freq: Two times a day (BID) | ORAL | Status: AC
  Administered 2020-03-30 (×2): 40 meq via ORAL
  Filled 2020-03-30 (×2): qty 2

## 2020-03-30 NOTE — Progress Notes (Signed)
Patient ID: COMMODORE BELLEW, male   DOB: [birth year], 39 y.o.   MRN: [id number]          [facility]    Date of Admission:  03/27/2020   Day 3 ampicillin sulbactam         His temperature is trending down on therapy for presumed lung abscess.  Blood cultures remain negative.  Sputum culture grew normal respiratory flora.  Sputum AFB stain is negative x1.  I will continue ampicillin sulbactam.         Michel Bickers, MD [facility] [place] [phone] pager   [phone] cell 03/30/2020, 11:41 AM

## 2020-03-30 NOTE — Progress Notes (Signed)
CRITICAL VALUE ALERT  Critical Value:  *HBG 6.8  Date & Time Notied:  03/30/20 0806  Provider Notified: Dr. Loleta Books  Orders Received/Actions taken: see orders

## 2020-03-30 NOTE — Progress Notes (Signed)
PROGRESS NOTE    CHADEN DOOM  RCV:[id number] DOB: 05/14/1981 DOA: 03/27/2020 PCP: Patient, No Pcp Per      Brief Narrative:  Mr. Pease is a 39 y.o. M with no significant PMHx who presented with about a week of chest/back pain and cough.  This progressed to fever, so he came to the ER, where CT imaging showed a large cavitary pneumonia.  Pulmonology and ID were consulted.       Assessment & Plan:  Sepsis from cavitary pneumonia Patient presented with fever, leukocytosis.  Pneumonia on imaging with cavitation.  ID suspect an odontogenic source.  Orthopantogram with caries only.  Pulmonary consulted, recommended no bronchoscopy -Continue Unasyn -Per pulmonology, patient will need 6 weeks of antibiotics, and subsequent imaging -Pulmonary follow-up has been arranged -Consult infectious disease, appreciate recommendations -Follow AFB smears from yesterday and today -Continue pulmonary toilet   Iron deficiency anemia Noted to have marked microcytic anemia here.  Iron stores low.  Reticulocyte index 0.9  No clinical bleeding, but hemoglobin down to 6.8 today -Give Feraheme -Transfuse 1 unit -Colonoscopy referral after discharge   Transaminitis LFTs doubled today.   Presumably this is from tachycardia, anemia. -Rule out viral hepatitis  Hypokalemia Mag normal -Replete K  Sinus tachycardia -Transfuse blood  -Continue antibiotics   Hyponatremia Mild, asymptomatic, stable        Disposition: Status is: Inpatient  Remains inpatient appropriate because:Still tachycardic to 130, still need to rule out TB still requiring blood transfusion   Dispo: The patient is from: Home              Anticipated d/c is to: Home              Anticipated d/c date is: 2 days              Patient currently is not medically stable to d/c.              MDM: The below labs and imaging reports were reviewed and summarized above.  Medication management as above.   This is a severe acute illness that poses a threat to life bodily function   DVT prophylaxis: enoxaparin (LOVENOX) injection 40 mg Start: 03/28/20 1000  Code Status: Full code Family Communication:     Consultants:   Pulmonology  Infectious disease  Procedures:     Antimicrobials:   Unasyn 8/6 >>   Culture data:   8/6 blood culture x2 -- no growth  8/6 sputum culture -- gram stain positive  8.6 AFB smear -- negative  8/7 AFB smear -- pending  8/8 AFB smear pending            Subjective: Feels much better.  Temp 100.4 overnight overnight.  No focal abdominal pain, vomiting, jaundice.  Objective: Vitals:   03/29/20 2052 03/30/20 0809 03/30/20 1500 03/30/20 1515  BP: 115/68 133/78 122/82 110/68  Pulse: (!) 131 (!) 138 70 (!) 119  Resp: 12 (!) 21 15 16   Temp: 99.5 F (37.5 C) (!) 100.4 F (38 C) 99.3 F (37.4 C) 99 F (37.2 C)  TempSrc: Oral  Oral Oral  SpO2: 98% 99%    Weight:      Height:       No intake or output data in the 24 hours ending 03/30/20 1654 Filed Weights   03/27/20 2044  Weight: 65.8 kg    Examination: General appearance: Well-nourished adult male, lying in bed, interactive, no acute distress     HEENT: Anicteric, conjunctival  pink, lids and lashes normal.  Nasal deformity, discharge, epistaxis, lips moist, dentition relatively poor but overall not bad, oropharynx moist, no oral lesions, torus, hearing normal Skin: No suspicious rashes or lesions on palms or soles Cardiac: Tachycardic, regular, no murmurs appreciated, JVP normal, no lower extremity edema Respiratory: Normal respiratory rate and rhythm, lungs diminished on the right, normal on the left, without rales or wheezes Abdomen: Abdomen soft without tenderness palpation or guarding, no right upper quadrant pain, no ascites or distention MSK: normal muscle bulk and tone Neuro: Awake and alert, extraocular movements intact, moves all extremities with normal strength and  crenation, speech fluent Psych: Attention normal, affect normal, judgment insight appear normal      Data Reviewed: I have personally reviewed following labs and imaging studies:  CBC: Recent Labs  Lab 03/27/20 2048 03/28/20 0909 03/29/20 0135 03/30/20 0151  WBC 31.6* 28.8* 24.8* 28.2*  NEUTROABS  --  23.3* 18.1*  --   HGB 9.9* 8.2* 7.8* 6.8*  HCT 30.7* 24.6* 23.8* 21.2*  MCV 84.6 82.6 82.6 85.1  PLT 622* 646* 673* 202*   Basic Metabolic Panel: Recent Labs  Lab 03/27/20 2048 03/28/20 0909 03/29/20 0135 03/30/20 0151  NA 130* 134* 135 133*  K 3.3* 3.1* 3.2* 3.1*  CL 87* 93* 95* 95*  CO2 29 30 29 27   GLUCOSE 163* 130* 116* 135*  BUN 11 7 6 7   CREATININE 0.96 0.73 0.77 0.89  CALCIUM 8.5* 8.0* 7.9* 7.6*  MG  --   --  2.0  --    GFR: Estimated Creatinine Clearance: 103.7 mL/min (by C-G formula based on SCr of 0.89 mg/dL). Liver Function Tests: Recent Labs  Lab 03/27/20 2048 03/28/20 0909 03/29/20 0135 03/30/20 0959  AST 30 24 36 118*  ALT 57* 44 47* 99*  ALKPHOS 103 75 66 65  BILITOT 1.9* 1.9* 1.9* 1.7*  PROT 7.7 6.5 6.2* 6.1*  ALBUMIN 2.3* 1.8* 1.6* 1.5*   No results for input(s): LIPASE, AMYLASE in the last 168 hours. No results for input(s): AMMONIA in the last 168 hours. Coagulation Profile: Recent Labs  Lab 03/28/20 0909  INR 1.3*   Cardiac Enzymes: No results for input(s): CKTOTAL, CKMB, CKMBINDEX, TROPONINI in the last 168 hours. BNP (last 3 results) No results for input(s): PROBNP in the last 8760 hours. HbA1C: No results for input(s): HGBA1C in the last 72 hours. CBG: No results for input(s): GLUCAP in the last 168 hours. Lipid Profile: No results for input(s): CHOL, HDL, LDLCALC, TRIG, CHOLHDL, LDLDIRECT in the last 72 hours. Thyroid Function Tests: No results for input(s): TSH, T4TOTAL, FREET4, T3FREE, THYROIDAB in the last 72 hours. Anemia Panel: Recent Labs    03/30/20 0151 03/30/20 0959  VITAMINB12 784  --   FOLATE 14.9  --     FERRITIN 1,474*  --   TIBC 120*  --   IRON 11*  --   RETICCTPCT  --  1.9   Urine analysis: No results found for: COLORURINE, APPEARANCEUR, LABSPEC, PHURINE, GLUCOSEU, HGBUR, BILIRUBINUR, KETONESUR, PROTEINUR, UROBILINOGEN, NITRITE, LEUKOCYTESUR Sepsis Labs: @LABRCNTIP (procalcitonin:4,lacticacidven:4)  ) Recent Results (from the past 240 hour(s))  Novel Coronavirus, NAA ([facility])     Status: None   Collection Time: 03/27/20  6:29 PM   Specimen: Nasopharyngeal Swab; Nasopharyngeal(NP) swabs in vial transport medium   Nasopharynge  Result Value Ref Range Status   SARS-CoV-2, NAA Not Detected Not Detected Final    Comment: This nucleic acid amplification test was developed and its performance characteristics determined by  [employer], [employer] and [employer]. Nucleic acid amplification tests include RT-PCR and TMA. This test has not been FDA cleared or approved. This test has been authorized by FDA under an Emergency Use Authorization (EUA). This test is only authorized for the duration of time the declaration that circumstances exist justifying the authorization of the emergency use of in vitro diagnostic tests for detection of SARS-CoV-2 virus and/or diagnosis of COVID-19 infection under section 564(b)(1) of the Act, 21 U.S.C. 867EHM-0(N) (1), unless the authorization is terminated or revoked sooner. When diagnostic testing is negative, the possibility of a false negative result should be considered in the context of a patient's recent exposures and the presence of clinical signs and symptoms consistent with COVID-19. An individual without symptoms of COVID-19 and who is not shedding SARS-CoV-2 virus wo uld expect to have a negative (not detected) result in this assay.   SARS-COV-2, NAA 2 DAY TAT     Status: None   Collection Time: 03/27/20  6:29 PM   Nasopharynge  Result Value Ref Range Status   SARS-CoV-2, NAA 2 DAY TAT Performed  Final  SARS Coronavirus 2 by RT PCR (hospital order,  performed in [place] hospital lab) Nasopharyngeal Nasopharyngeal Swab     Status: None   Collection Time: 03/28/20  3:37 AM   Specimen: Nasopharyngeal Swab  Result Value Ref Range Status   SARS Coronavirus 2 NEGATIVE NEGATIVE Final    Comment: (NOTE) SARS-CoV-2 target nucleic acids are NOT DETECTED.  The SARS-CoV-2 RNA is generally detectable in upper and lower respiratory specimens during the acute phase of infection. The lowest concentration of SARS-CoV-2 viral copies this assay can detect is 250 copies / mL. A negative result does not preclude SARS-CoV-2 infection and should not be used as the sole basis for treatment or other patient management decisions.  A negative result may occur with improper specimen collection / handling, submission of specimen other than nasopharyngeal swab, presence of viral mutation(s) within the areas targeted by this assay, and inadequate number of viral copies (<250 copies / mL). A negative result must be combined with clinical observations, patient history, and epidemiological information.  Fact Sheet for Patients:   StrictlyIdeas.no  Fact Sheet for Healthcare Providers: BankingDealers.co.za  This test is not yet approved or  cleared by the [place] FDA and has been authorized for detection and/or diagnosis of SARS-CoV-2 by FDA under an Emergency Use Authorization (EUA).  This EUA will remain in effect (meaning this test can be used) for the duration of the COVID-19 declaration under Section 564(b)(1) of the Act, 21 U.S.C. section 360bbb-3(b)(1), unless the authorization is terminated or revoked sooner.  Performed at [facility], [place] [address] [place], [place] [address]   Culture, blood (single)     Status: None (Preliminary result)   Collection Time: 03/28/20  5:57 AM   Specimen: BLOOD  Result Value Ref Range Status   Specimen Description BLOOD RIGHT ANTECUBITAL  Final    Special Requests   Final    BOTTLES DRAWN AEROBIC AND ANAEROBIC Blood Culture results may not be optimal due to an inadequate volume of blood received in culture bottles   Culture   Final    NO GROWTH 2 DAYS Performed at [facility], [place] [address] [place], [place] [address]    Report Status PENDING  Incomplete  Culture, blood (x 2)     Status: None (Preliminary result)   Collection Time: 03/28/20  9:09 AM   Specimen: BLOOD LEFT HAND  Result Value Ref Range Status   Specimen Description BLOOD LEFT HAND  Final   Special Requests   Final    BOTTLES DRAWN AEROBIC AND ANAEROBIC Blood Culture adequate volume   Culture   Final    NO GROWTH 2 DAYS Performed at [facility], [address], [place], [place] [address]    Report Status PENDING  Incomplete  Culture, blood (x 2)     Status: None (Preliminary result)   Collection Time: 03/28/20 10:00 AM   Specimen: BLOOD  Result Value Ref Range Status   Specimen Description BLOOD RIGHT ANTECUBITAL  Final   Special Requests   Final    BOTTLES DRAWN AEROBIC AND ANAEROBIC Blood Culture adequate volume   Culture   Final    NO GROWTH 2 DAYS Performed at [facility], [place] [address] [place], [place] [address]    Report Status PENDING  Incomplete  Acid Fast Smear (AFB)     Status: None   Collection Time: 03/28/20 10:15 AM   Specimen: Sputum  Result Value Ref Range Status   AFB Specimen Processing Concentration  Final   Acid Fast Smear Negative  Final    Comment: (NOTE) Performed At: [facility] [place], [place] [id number] Rush Farmer MD TI:[id number]    Source (AFB) EXPECTORATED SPUTUM  Final    Comment: Performed at [facility], [place] [address] [place], [place] [address]  Expectorated sputum assessment w rflx to resp cult     Status: None   Collection Time: 03/28/20 10:15 AM   Specimen: Expectorated Sputum  Result Value Ref Range Status   Specimen Description EXPECTORATED SPUTUM   Final   Special Requests NONE  Final   Sputum evaluation   Final    THIS SPECIMEN IS ACCEPTABLE FOR SPUTUM CULTURE Performed at [facility], [place] [address], [place], [place] [address]    Report Status 03/29/2020 FINAL  Final  Culture, respiratory     Status: None   Collection Time: 03/28/20 10:15 AM  Result Value Ref Range Status   Specimen Description EXPECTORATED SPUTUM  Final   Special Requests NONE Reflexed from [id number]  Final   Gram Stain   Final    ABUNDANT WBC PRESENT,BOTH PMN AND MONONUCLEAR ABUNDANT GRAM POSITIVE COCCI MODERATE GRAM VARIABLE ROD FEW GRAM NEGATIVE RODS    Culture   Final    RARE Consistent with normal respiratory flora. No Pseudomonas species isolated Performed at [place] [address] [place], [place] [address]    Report Status 03/30/2020 FINAL  Final         Radiology Studies: No results found.      Scheduled Meds: . enoxaparin (LOVENOX) injection  40 mg Subcutaneous Q24H  . folic acid  1 mg Oral Daily  . guaiFENesin  600 mg Oral BID  . lactobacillus acidophilus  2 tablet Oral TID  . multivitamin with minerals  1 tablet Oral Daily  . potassium chloride  40 mEq Oral BID  . thiamine  100 mg Oral Daily   Or  . thiamine  100 mg Intravenous Daily   Continuous Infusions: . ampicillin-sulbactam (UNASYN) IV 3 g (03/30/20 1512)     LOS: 2 days    Time spent: 35 minutes    Edwin Dada, MD [facility] 03/30/2020, 4:54 PM     Please page though [place] or Epic secure chat:  For [employer], Adult nurse

## 2020-03-31 ENCOUNTER — Telehealth: Payer: Self-pay | Admitting: *Deleted

## 2020-03-31 DIAGNOSIS — J189 Pneumonia, unspecified organism: Secondary | ICD-10-CM

## 2020-03-31 LAB — COMPREHENSIVE METABOLIC PANEL
ALT: 155 U/L — ABNORMAL HIGH (ref 0–44)
AST: 163 U/L — ABNORMAL HIGH (ref 15–41)
Albumin: 1.5 g/dL — ABNORMAL LOW (ref 3.5–5.0)
Alkaline Phosphatase: 75 U/L (ref 38–126)
Anion gap: 8 (ref 5–15)
BUN: 9 mg/dL (ref 6–20)
CO2: 27 mmol/L (ref 22–32)
Calcium: 7.6 mg/dL — ABNORMAL LOW (ref 8.9–10.3)
Chloride: 99 mmol/L (ref 98–111)
Creatinine, Ser: 0.75 mg/dL (ref 0.61–1.24)
GFR calc Af Amer: >60 mL/min (ref >60)
GFR calc non Af Amer: >60 mL/min (ref >60)
Glucose, Bld: 111 mg/dL — ABNORMAL HIGH (ref 70–99)
Potassium: 3.4 mmol/L — ABNORMAL LOW (ref 3.5–5.1)
Sodium: 134 mmol/L — ABNORMAL LOW (ref 135–145)
Total Bilirubin: 1.5 mg/dL — ABNORMAL HIGH (ref 0.3–1.2)
Total Protein: 6 g/dL — ABNORMAL LOW (ref 6.5–8.1)

## 2020-03-31 LAB — BPAM RBC
Blood Product Expiration Date: 202109092359
ISSUE DATE / TIME: 202108081454
Unit Type and Rh: 7300

## 2020-03-31 LAB — CBC
HCT: 23.4 % — ABNORMAL LOW (ref 39.0–52.0)
Hemoglobin: 7.7 g/dL — ABNORMAL LOW (ref 13.0–17.0)
MCH: 27.5 pg (ref 26.0–34.0)
MCHC: 32.9 g/dL (ref 30.0–36.0)
MCV: 83.6 fL (ref 80.0–100.0)
Platelets: 640 10*3/uL — ABNORMAL HIGH (ref 150–400)
RBC: 2.8 MIL/uL — ABNORMAL LOW (ref 4.22–5.81)
RDW: 13.7 % (ref 11.5–15.5)
WBC: 27.2 10*3/uL — ABNORMAL HIGH (ref 4.0–10.5)
nRBC: 0.1 % (ref 0.0–0.2)

## 2020-03-31 LAB — TYPE AND SCREEN
ABO/RH(D): B POS
Antibody Screen: NEGATIVE
Unit division: 0

## 2020-03-31 LAB — HEPATITIS PANEL, ACUTE
HCV Ab: NONREACTIVE
Hep A IgM: NONREACTIVE
Hep B C IgM: NONREACTIVE
Hepatitis B Surface Ag: NONREACTIVE

## 2020-03-31 LAB — OCCULT BLOOD X 1 CARD TO LAB, STOOL: Fecal Occult Bld: NEGATIVE

## 2020-03-31 LAB — HAPTOGLOBIN: Haptoglobin: 494 mg/dL — ABNORMAL HIGH (ref 17–317)

## 2020-03-31 NOTE — Telephone Encounter (Signed)
-----   Message from Lanier Clam, MD sent at 03/30/2020  4:43 PM EDT ----- Regarding: Hosp f/u Can this patient be scheduled for hospital follow-up in 4 weeks and a CT chest without contrast be ordered a day or 2 before seeing him in clinic.  Need images to decide if he will need longer antibiotics or or surgery or if he can be done with therapy.  Right upper lobe cavitary pneumonia.  Thanks,  [facility]

## 2020-03-31 NOTE — Progress Notes (Signed)
PROGRESS NOTE    JAMAURION SLEMMER  FTD:[id number] DOB: 27-Sep-1980 DOA: 03/27/2020 PCP: Patient, No Pcp Per      Brief Narrative:  Mr. Eller is a 39 y.o. M with no significant PMHx who presented with about a week of chest/back pain and cough.  This progressed to fever, so he came to the ER, where CT imaging showed a large cavitary pneumonia.  Pulmonology and ID were consulted.       Assessment & Plan:  Sepsis from cavitary pneumonia Patient presented with fever, leukocytosis.  Pneumonia on imaging with cavitation.  ID suspect an odontogenic source. TB needs to be ruled out. Orthopantogram with caries only.    Pulmonary consulted.  They recommend 4-6 weeks Unasyn/Augmentin and then outpatient Pulm follow up, repeat imaging and decision at that time about further antibiotics, surgery or completion of therapy.  No bronchoscopy needed. -Continue Unasyn  -Pulmonary follow-up has been requested  -Consult infectious disease, appreciate recommendations -Follow AFB smears from 8/7 and 8/8, discussed with lab,  Still pending -Continue pulmonary toilet    Iron deficiency anemia Noted to have marked microcytic anemia here.  Iron stores low.  Reticulocyte index 0.9  His haptoglobin and Tbili do not support hemolysis. This appears to be purely iron defiicency.   No clinical bleeding, but hemoglobin down to 6.8 on 8/8, so transfused 1 unit and feraheme. -Will need colonoscopy referral after discharge -If tachycardia persists, would favor a transfusion threshold of 8 g/dL  Transaminitis LFTs trending up.  Viral serologies negative. -Trend LFTs  Hypokalemia Mild  Sinus tachycardia Not responsive to fluids or blood transfusion.   -Continue antibiotics   Hyponatremia Mild, asymptomatic, stable        Disposition: Status is: Inpatient  Remains inpatient appropriate because: still tachycardic >100, still requiring TB rule out    Dispo: The patient is from: Home               Anticipated d/c is to: Home              Anticipated d/c date is: 2 days              Patient currently is not medically stable to d/c.   Patient admitted with a cavitary pneumonia.  He remains quite tachycardic and on airborne precautions while we await his AFB sputum results.  Ultimately, I feel he should remain on IV therapy, probably receive another transfusion, and see LFTs improve prior to discharge, so at the earliest Wednesday           MDM: The below labs and imaging reports were reviewed and summarized above.  Medication management as above.  This is a severe acute illness that poses a threat to life bodily function   DVT prophylaxis: enoxaparin (LOVENOX) injection 40 mg Start: 03/28/20 1000  Code Status: Full code Family Communication:     Consultants:   Pulmonology  Infectious disease  Procedures:     Antimicrobials:   Unasyn 8/6 >>   Culture data:   8/6 blood culture x2 -- no growth  8/6 sputum culture -- gram stain positive  8.6 AFB smear -- negative  8/7 AFB smear -- pending  8/8 AFB smear pending            Subjective: Feels well.  No chest pain, no dyspnea.  No confusion.  No vomiting abdominal pain.        Objective: Vitals:   03/30/20 1939 03/31/20 0800 03/31/20 1546 03/31/20 2008  BP:  125/70 114/81 (!) 145/87  Pulse:  (!) 122 (!) 146 (!) 126  Resp:  19 19 16   Temp:  100 F (37.8 C) 98.7 F (37.1 C) 98 F (36.7 C)  TempSrc: Oral     SpO2:  99% 99% 100%  Weight:      Height:        Intake/Output Summary (Last 24 hours) at 03/31/2020 2037 Last data filed at 03/31/2020 1745 Gross per 24 hour  Intake --  Output 750 ml  Net -750 ml   Filed Weights   03/27/20 2044  Weight: 65.8 kg    Examination: General appearance: Well-nourished adult male, lying in bed, interactive, no acute distress     HEENT: Anicteric, conjunctival pink, lids and lashes normal.  Nasal deformity, discharge, epistaxis, lips moist,  dentition relatively poor but overall not bad, oropharynx moist, no oral lesions, torus, hearing normal Skin: No suspicious rashes or lesions on palms or soles Cardiac: Tachycardic, regular, no murmurs appreciated, JVP normal, no lower extremity edema Respiratory: Normal respiratory rate and rhythm, lungs diminished on the right, normal on the left, without rales or wheezes Abdomen: Abdomen soft without tenderness palpation or guarding, no right upper quadrant pain, no ascites or distention MSK: normal muscle bulk and tone Neuro: Awake and alert, extraocular movements intact, moves all extremities with normal strength and crenation, speech fluent Psych: Attention normal, affect normal, judgment insight appear normal      Data Reviewed: I have personally reviewed following labs and imaging studies:  CBC: Recent Labs  Lab 03/27/20 2048 03/27/20 2048 03/28/20 0909 03/29/20 0135 03/30/20 0151 03/30/20 1947 03/31/20 0141  WBC 31.6*  --  28.8* 24.8* 28.2*  --  27.2*  NEUTROABS  --   --  23.3* 18.1*  --   --   --   HGB 9.9*   < > 8.2* 7.8* 6.8* 8.4* 7.7*  HCT 30.7*   < > 24.6* 23.8* 21.2* 26.0* 23.4*  MCV 84.6  --  82.6 82.6 85.1  --  83.6  PLT 622*  --  646* 673* 666*  --  640*   < > = values in this interval not displayed.   Basic Metabolic Panel: Recent Labs  Lab 03/27/20 2048 03/28/20 0909 03/29/20 0135 03/30/20 0151 03/31/20 0141  NA 130* 134* 135 133* 134*  K 3.3* 3.1* 3.2* 3.1* 3.4*  CL 87* 93* 95* 95* 99  CO2 29 30 29 27 27   GLUCOSE 163* 130* 116* 135* 111*  BUN 11 7 6 7 9   CREATININE 0.96 0.73 0.77 0.89 0.75  CALCIUM 8.5* 8.0* 7.9* 7.6* 7.6*  MG  --   --  2.0  --   --    GFR: Estimated Creatinine Clearance: 114.2 mL/min (by C-G formula based on SCr of 0.75 mg/dL). Liver Function Tests: Recent Labs  Lab 03/27/20 2048 03/28/20 0909 03/29/20 0135 03/30/20 0959 03/31/20 0141  AST 30 24 36 118* 163*  ALT 57* 44 47* 99* 155*  ALKPHOS 103 75 66 65 75  BILITOT  1.9* 1.9* 1.9* 1.7* 1.5*  PROT 7.7 6.5 6.2* 6.1* 6.0*  ALBUMIN 2.3* 1.8* 1.6* 1.5* 1.5*   No results for input(s): LIPASE, AMYLASE in the last 168 hours. No results for input(s): AMMONIA in the last 168 hours. Coagulation Profile: Recent Labs  Lab 03/28/20 0909  INR 1.3*   Cardiac Enzymes: No results for input(s): CKTOTAL, CKMB, CKMBINDEX, TROPONINI in the last 168 hours. BNP (last 3 results) No results for input(s): PROBNP in  the last 8760 hours. HbA1C: No results for input(s): HGBA1C in the last 72 hours. CBG: No results for input(s): GLUCAP in the last 168 hours. Lipid Profile: No results for input(s): CHOL, HDL, LDLCALC, TRIG, CHOLHDL, LDLDIRECT in the last 72 hours. Thyroid Function Tests: No results for input(s): TSH, T4TOTAL, FREET4, T3FREE, THYROIDAB in the last 72 hours. Anemia Panel: Recent Labs    03/30/20 0151 03/30/20 0959  VITAMINB12 784  --   FOLATE 14.9  --   FERRITIN 1,474*  --   TIBC 120*  --   IRON 11*  --   RETICCTPCT  --  1.9   Urine analysis: No results found for: COLORURINE, APPEARANCEUR, LABSPEC, PHURINE, GLUCOSEU, HGBUR, BILIRUBINUR, KETONESUR, PROTEINUR, UROBILINOGEN, NITRITE, LEUKOCYTESUR Sepsis Labs: @LABRCNTIP (procalcitonin:4,lacticacidven:4)  ) Recent Results (from the past 240 hour(s))  Novel Coronavirus, NAA ([facility])     Status: None   Collection Time: 03/27/20  6:29 PM   Specimen: Nasopharyngeal Swab; Nasopharyngeal(NP) swabs in vial transport medium   Nasopharynge  Result Value Ref Range Status   SARS-CoV-2, NAA Not Detected Not Detected Final    Comment: This nucleic acid amplification test was developed and its performance characteristics determined by [employer]. Nucleic acid amplification tests include RT-PCR and TMA. This test has not been FDA cleared or approved. This test has been authorized by FDA under an Emergency Use Authorization (EUA). This test is only authorized for the duration of time the declaration  that circumstances exist justifying the authorization of the emergency use of in vitro diagnostic tests for detection of SARS-CoV-2 virus and/or diagnosis of COVID-19 infection under section 564(b)(1) of the Act, 21 U.S.C. 366YQI-3(K) (1), unless the authorization is terminated or revoked sooner. When diagnostic testing is negative, the possibility of a false negative result should be considered in the context of a patient's recent exposures and the presence of clinical signs and symptoms consistent with COVID-19. An individual without symptoms of COVID-19 and who is not shedding SARS-CoV-2 virus wo uld expect to have a negative (not detected) result in this assay.   SARS-COV-2, NAA 2 DAY TAT     Status: None   Collection Time: 03/27/20  6:29 PM   Nasopharynge  Result Value Ref Range Status   SARS-CoV-2, NAA 2 DAY TAT Performed  Final  SARS Coronavirus 2 by RT PCR (hospital order, performed in [place] hospital lab) Nasopharyngeal Nasopharyngeal Swab     Status: None   Collection Time: 03/28/20  3:37 AM   Specimen: Nasopharyngeal Swab  Result Value Ref Range Status   SARS Coronavirus 2 NEGATIVE NEGATIVE Final    Comment: (NOTE) SARS-CoV-2 target nucleic acids are NOT DETECTED.  The SARS-CoV-2 RNA is generally detectable in upper and lower respiratory specimens during the acute phase of infection. The lowest concentration of SARS-CoV-2 viral copies this assay can detect is 250 copies / mL. A negative result does not preclude SARS-CoV-2 infection and should not be used as the sole basis for treatment or other patient management decisions.  A negative result may occur with improper specimen collection / handling, submission of specimen other than nasopharyngeal swab, presence of viral mutation(s) within the areas targeted by this assay, and inadequate number of viral copies (<250 copies / mL). A negative result must be combined with clinical observations, patient history, and  epidemiological information.  Fact Sheet for Patients:   StrictlyIdeas.no  Fact Sheet for Healthcare Providers: BankingDealers.co.za  This test is not yet approved or  cleared by the [place] FDA and  has been authorized for detection and/or diagnosis of SARS-CoV-2 by FDA under an Emergency Use Authorization (EUA).  This EUA will remain in effect (meaning this test can be used) for the duration of the COVID-19 declaration under Section 564(b)(1) of the Act, 21 U.S.C. section 360bbb-3(b)(1), unless the authorization is terminated or revoked sooner.  Performed at [facility], [place] [address] [place], [place] [address]   Culture, blood (single)     Status: None (Preliminary result)   Collection Time: 03/28/20  5:57 AM   Specimen: BLOOD  Result Value Ref Range Status   Specimen Description BLOOD RIGHT ANTECUBITAL  Final   Special Requests   Final    BOTTLES DRAWN AEROBIC AND ANAEROBIC Blood Culture results may not be optimal due to an inadequate volume of blood received in culture bottles   Culture   Final    NO GROWTH 3 DAYS Performed at [facility], [place] [address] [place], [place] [address]    Report Status PENDING  Incomplete  Culture, blood (x 2)     Status: None (Preliminary result)   Collection Time: 03/28/20  9:09 AM   Specimen: BLOOD LEFT HAND  Result Value Ref Range Status   Specimen Description BLOOD LEFT HAND  Final   Special Requests   Final    BOTTLES DRAWN AEROBIC AND ANAEROBIC Blood Culture adequate volume   Culture   Final    NO GROWTH 3 DAYS Performed at [facility], [place] [address], [place], [place] [address]    Report Status PENDING  Incomplete  Culture, blood (x 2)     Status: None (Preliminary result)   Collection Time: 03/28/20 10:00 AM   Specimen: BLOOD  Result Value Ref Range Status   Specimen Description BLOOD RIGHT ANTECUBITAL  Final   Special Requests   Final    BOTTLES DRAWN  AEROBIC AND ANAEROBIC Blood Culture adequate volume   Culture   Final    NO GROWTH 3 DAYS Performed at [facility], [place] [address] [place], [place] [address]    Report Status PENDING  Incomplete  Acid Fast Smear (AFB)     Status: None   Collection Time: 03/28/20 10:15 AM   Specimen: Sputum  Result Value Ref Range Status   AFB Specimen Processing Concentration  Final   Acid Fast Smear Negative  Final    Comment: (NOTE) Performed At: [facility] [place], [place] [id number] Rush Farmer MD FO:[id number]    Source (AFB) EXPECTORATED SPUTUM  Final    Comment: Performed at [facility], [place] [address] [place], [place] [address]  Expectorated sputum assessment w rflx to resp cult     Status: None   Collection Time: 03/28/20 10:15 AM   Specimen: Expectorated Sputum  Result Value Ref Range Status   Specimen Description EXPECTORATED SPUTUM  Final   Special Requests NONE  Final   Sputum evaluation   Final    THIS SPECIMEN IS ACCEPTABLE FOR SPUTUM CULTURE Performed at [facility], [place] [address] [place], [place] [address]    Report Status 03/29/2020 FINAL  Final  Culture, respiratory     Status: None   Collection Time: 03/28/20 10:15 AM  Result Value Ref Range Status   Specimen Description EXPECTORATED SPUTUM  Final   Special Requests NONE Reflexed from [id number]  Final   Gram Stain   Final    ABUNDANT WBC PRESENT,BOTH PMN AND MONONUCLEAR ABUNDANT GRAM POSITIVE  COCCI MODERATE GRAM VARIABLE ROD FEW GRAM NEGATIVE RODS    Culture   Final    RARE Consistent with normal respiratory flora. No Pseudomonas species isolated Performed at [facility] [address] [place], [place] [address]    Report Status 03/30/2020 FINAL  Final         Radiology Studies: No results found.      Scheduled Meds: . enoxaparin (LOVENOX) injection  40 mg Subcutaneous Q24H  . folic acid  1 mg Oral Daily  . guaiFENesin  600 mg Oral BID  .  lactobacillus acidophilus  2 tablet Oral TID   Continuous Infusions: . ampicillin-sulbactam (UNASYN) IV 3 g (03/31/20 1609)     LOS: 3 days    Time spent: 35 minutes    Edwin Dada, MD [facility] 03/31/2020, 8:37 PM     Please page though [place] or Epic secure chat:  For [employer], Adult nurse

## 2020-04-01 ENCOUNTER — Inpatient Hospital Stay (HOSPITAL_COMMUNITY): Payer: Self-pay

## 2020-04-01 DIAGNOSIS — D72829 Elevated white blood cell count, unspecified: Secondary | ICD-10-CM

## 2020-04-01 DIAGNOSIS — J159 Unspecified bacterial pneumonia: Secondary | ICD-10-CM

## 2020-04-01 DIAGNOSIS — B9689 Other specified bacterial agents as the cause of diseases classified elsewhere: Secondary | ICD-10-CM

## 2020-04-01 DIAGNOSIS — J430 Unilateral pulmonary emphysema [MacLeod's syndrome]: Secondary | ICD-10-CM

## 2020-04-01 LAB — COMPREHENSIVE METABOLIC PANEL
ALT: 151 U/L — ABNORMAL HIGH (ref 0–44)
AST: 110 U/L — ABNORMAL HIGH (ref 15–41)
Albumin: 1.6 g/dL — ABNORMAL LOW (ref 3.5–5.0)
Alkaline Phosphatase: 77 U/L (ref 38–126)
Anion gap: 11 (ref 5–15)
BUN: 5 mg/dL — ABNORMAL LOW (ref 6–20)
CO2: 27 mmol/L (ref 22–32)
Calcium: 8.1 mg/dL — ABNORMAL LOW (ref 8.9–10.3)
Chloride: 98 mmol/L (ref 98–111)
Creatinine, Ser: 0.71 mg/dL (ref 0.61–1.24)
GFR calc Af Amer: >60 mL/min (ref >60)
GFR calc non Af Amer: >60 mL/min (ref >60)
Glucose, Bld: 148 mg/dL — ABNORMAL HIGH (ref 70–99)
Potassium: 3.2 mmol/L — ABNORMAL LOW (ref 3.5–5.1)
Sodium: 136 mmol/L (ref 135–145)
Total Bilirubin: 0.7 mg/dL (ref 0.3–1.2)
Total Protein: 6.7 g/dL (ref 6.5–8.1)

## 2020-04-01 LAB — CBC
HCT: 23.9 % — ABNORMAL LOW (ref 39.0–52.0)
Hemoglobin: 7.7 g/dL — ABNORMAL LOW (ref 13.0–17.0)
MCH: 27.7 pg (ref 26.0–34.0)
MCHC: 32.2 g/dL (ref 30.0–36.0)
MCV: 86 fL (ref 80.0–100.0)
Platelets: 735 10*3/uL — ABNORMAL HIGH (ref 150–400)
RBC: 2.78 MIL/uL — ABNORMAL LOW (ref 4.22–5.81)
RDW: 14.2 % (ref 11.5–15.5)
WBC: 33.5 10*3/uL — ABNORMAL HIGH (ref 4.0–10.5)
nRBC: 0.2 % (ref 0.0–0.2)

## 2020-04-01 LAB — ACID FAST SMEAR (AFB, MYCOBACTERIA): Acid Fast Smear: NEGATIVE

## 2020-04-01 LAB — PATHOLOGIST SMEAR REVIEW

## 2020-04-01 MED ORDER — POTASSIUM CHLORIDE CRYS ER 20 MEQ PO TBCR
40.0000 meq | EXTENDED_RELEASE_TABLET | Freq: Two times a day (BID) | ORAL | Status: AC
  Administered 2020-04-01 (×2): 40 meq via ORAL
  Filled 2020-04-01 (×2): qty 2

## 2020-04-01 NOTE — Progress Notes (Signed)
[facility] for Infectious Disease    Date of Admission:  03/27/2020   Total days of antibiotics 6           ID: Andrew [name] is a 39 y.o. male with  Right sided cavitary pneumonia, emphysematous changes Active Problems:   Necrotizing pneumonia (HCC)   Sepsis ([place])   Alcohol use   Cannabis abuse   Hyponatremia   Hypokalemia    Subjective: Still having productive cough, WBC continues to be elevated but remains afebrile  Called [facility] and AFB smears still pending.  Reviewed CT findings with the patient  Medications:  . enoxaparin (LOVENOX) injection  40 mg Subcutaneous Q24H  . folic acid  1 mg Oral Daily  . guaiFENesin  600 mg Oral BID  . lactobacillus acidophilus  2 tablet Oral TID  . potassium chloride  40 mEq Oral BID    Objective: Vital signs in last 24 hours: Temp:  [98 F (36.7 C)-100 F (37.8 C)] 99.1 F (37.3 C) (08/10 1556) Pulse Rate:  [111-128] 111 (08/10 1556) Resp:  [14-18] 14 (08/10 1556) BP: (123-145)/(70-87) 123/74 (08/10 1556) SpO2:  [99 %-100 %] 99 % (08/10 1556) Physical Exam  Constitutional: He is oriented to person, place, and time. He appears well-developed and well-nourished. No distress.  HENT:  Mouth/Throat: Oropharynx is clear and moist. No oropharyngeal exudate.  Cardiovascular: Normal rate, regular rhythm and normal heart sounds. Exam reveals no gallop and no friction rub.  No murmur heard.  Pulmonary/Chest: Effort normal and breath sounds normal. No respiratory distress. He has no wheezes. Decrease breath sounds on the right Abdominal: Soft. Bowel sounds are normal. He exhibits no distension. There is no tenderness.  Lymphadenopathy:  He has no cervical adenopathy.  Neurological: He is alert and oriented to person, place, and time.  Skin: Skin is warm and dry. No rash noted. No erythema.  Psychiatric: He has a normal mood and affect. His behavior is normal.     Lab Results Recent Labs    03/31/20 0141 04/01/20 0833    WBC 27.2* 33.5*  HGB 7.7* 7.7*  HCT 23.4* 23.9*  NA 134* 136  K 3.4* 3.2*  CL 99 98  CO2 27 27  BUN 9 5*  CREATININE 0.75 0.71   Liver Panel Recent Labs    03/30/20 0959 03/30/20 0959 03/31/20 0141 04/01/20 0833  PROT 6.1*   < > 6.0* 6.7  ALBUMIN 1.5*   < > 1.5* 1.6*  AST 118*   < > 163* 110*  ALT 99*   < > 155* 151*  ALKPHOS 65   < > 75 77  BILITOT 1.7*   < > 1.5* 0.7  BILIDIR 0.6*  --   --   --   IBILI 1.1*  --   --   --    < > = values in this interval not displayed.   Sedimentation Rate No results for input(s): ESRSEDRATE in the last 72 hours. C-Reactive Protein No results for input(s): CRP in the last 72 hours.  Microbiology:  Studies/Results: DG CHEST PORT 1 VIEW  Result Date: 04/01/2020 CLINICAL DATA:  Follow-up known right-sided cavitary lesion EXAM: PORTABLE CHEST 1 VIEW COMPARISON:  03/28/2020 CT FINDINGS: Cardiac shadow is stable. Left lung remains clear. Persistent consolidation in the right upper lobe is noted with apical cavitary lesion similar to that noted on the prior exam. No new focal abnormality is noted. IMPRESSION: Stable right upper lobe infiltrate and cavitary lesion as described. Electronically  Signed   By: Inez Catalina M.D.   On: 04/01/2020 10:42     Assessment/Plan: Bacterial pneumonia = continue with current regimen of ampsub. Recommend 2 wk of iv abtx tehn convert to orals  Will follow up again with lab on AFB smears. Have low suspicion for mTB, but possibly may have NTM. Interesting that has such bullous changes to right lung.  Leukocytosis = continue to monitor for now.  Recommend to stop probiotics [facility] Cell: [phone] Pager: [phone]  04/01/2020, 6:59 PM

## 2020-04-01 NOTE — Progress Notes (Signed)
Andrew Andrew [name], Andrew Andrew [name], Andrew Pcp Per      Brief Narrative:  Andrew Andrew [name] is Andrew [name] 39 y.o. M with history of alcohol and marijuana use who presented on 03/27/2020 with about Andrew [name] week of Andrew [name], Andrew Andrew [name], Andrew Andrew [name] large cavitary pneumonia.  Has responded well to Unasyn with improvement in symptoms.  Because it was Andrew [name] right upper lobe pneumonia, AFB smears have been sent but are still pending.  Andrew [name] and his sister are very frustrated that AFB smears are still pending and they would like for him to be discharged as soon as possible.  Andrew [name] states he feels much better.   Assessment & Plan:  Sepsis from cavitary pneumonia Andrew [name] is responding well to Unasyn. There is some consideration of Andrew [name] PICC line for IV Unasyn versus discharge on p.o. Augmentin. AFB smears are still pending Being followed by ID and pulmonary who recommend 4 to 6 weeks of Unasyn/Augmentin with follow-up imaging to ensure resolution of opacification. Necrotizing pneumonia with cavitation thought to be secondary to odontogenic source  Leukocytosis Persist despite treatment of infection and anemia Can consider hematology consultation as an outpatient versus leukemoid reaction  Hypokalemia Will replete and recheck  Iron deficiency anemia Noted to have marked microcytic anemia here.  Iron stores low.  Reticulocyte index 0.9 His haptoglobin and Tbili do not support hemolysis. This appears to be purely iron defiicency.  Andrew clinical bleeding, but hemoglobin down to 6.8 on 8/8, so transfused 1 unit and feraheme. -Will need colonoscopy referral after discharge  Transaminitis Viral serologies negative.   Hyponatremia Resolved   Disposition: Status is: Inpatient  Remains inpatient appropriate because: still tachycardic >100, still requiring TB rule out    Dispo: The Andrew [name] is from: Home              Anticipated  d/c is to: Home              Anticipated d/c date is: 2 days              Andrew [name] currently is not medically stable to d/c.  Await AFB results Ultimately, I feel he should remain on IV therapy, probably receive another transfusion, and see LFTs improve prior to discharge, so at the earliest Wednesday    MDM: The below labs and imaging reports were reviewed and summarized above.  Medication management as above.  This is Andrew [name] severe acute illness that poses Andrew [name] threat to life bodily function   DVT prophylaxis: enoxaparin (LOVENOX) injection 40 mg Start: 03/28/20 1000  Code Status: Full code Family Communication: Sister at bedside throughout    Consultants:   Pulmonology  Infectious disease  Procedures:     Antimicrobials:   Unasyn 8/6 >>   Culture data:   8/6 blood culture x2 -- Andrew growth  8/6 sputum culture -- gram stain positive  8.6 AFB smear -- negative  8/7 AFB smear -- pending  8/8 AFB smear pending     Subjective:  Long discussion with Andrew [name] and his sister.  Andrew [name] sister is very upset that it is taking so long for AFB smears to return.  She notes that her sister died of probable pneumonia at [facility] exactly 2 years ago today within 24 hours of her admission.  She is very afraid that something similar is can happen to her brother.  Andrew [name] states he feels fine and is able  to walk around the room without any difficulty.  Notes cough is much improved with decreased sputum production.  Notes sputum is now white instead of yellow.  Objective: Vitals:   03/31/20 2300 04/01/20 0801 04/01/20 0830 04/01/20 1556  BP: 126/70 132/79 128/84 123/74  Pulse: (!) 116 (!) 125 (!) 128 (!) 111  Resp: 16 16 18 14   Temp: 98.7 F (37.1 C) 100 F (37.8 C) 100 F (37.8 C) 99.1 F (37.3 C)  TempSrc:    Oral  SpO2: 100% 99% 99% 99%  Weight:      Height:        Intake/Output Summary (Last 24 hours) at 04/01/2020 1905 Last data filed at 04/01/2020 1001 Gross per 24 hour   Intake 680 ml  Output 1500 ml  Net -820 ml   Filed Weights   03/27/20 2044  Weight: 65.8 kg    Physical Exam: Blood pressure 123/74, pulse (!) 111, temperature 99.1 F (37.3 C), temperature source Oral, resp. rate 14, height 5\' 8"  (1.727 m), weight 65.8 kg, SpO2 99 %. Gen: Markedly well appearing man sitting up in bed in Andrew acute distress. Eyes: sclera anicteric, conjuctiva mildly injected bilaterally CVS: S1-S2, regulary, Andrew gallops Respiratory:  decreased air entry likely secondary to decreased inspiratory effort, bronchial breath sounds in upper lobes bilaterally. GI: NABS, soft, NT  LE: Andrew edema. Andrew cyanosis Neuro: Andrew [name]/O x 3, Moving all extremities equally with normal strength, CN 3-12 intact, grossly nonfocal.      Data Reviewed: I have personally reviewed following labs and imaging studies:  CBC: Recent Labs  Lab 03/28/20 0909 03/28/20 0909 03/29/20 0135 03/30/20 0151 03/30/20 1947 03/31/20 0141 04/01/20 0833  WBC 28.8*  --  24.8* 28.2*  --  27.2* 33.5*  NEUTROABS 23.3*  --  18.1*  --   --   --   --   HGB 8.2*   < > 7.8* 6.8* 8.4* 7.7* 7.7*  HCT 24.6*   < > 23.8* 21.2* 26.0* 23.4* 23.9*  MCV 82.6  --  82.6 85.1  --  83.6 86.0  PLT 646*  --  673* 666*  --  640* 735*   < > = values in this interval not displayed.   Basic Metabolic Panel: Recent Labs  Lab 03/28/20 0909 03/29/20 0135 03/30/20 0151 03/31/20 0141 04/01/20 0833  NA 134* 135 133* 134* 136  K 3.1* 3.2* 3.1* 3.4* 3.2*  CL 93* 95* 95* 99 98  CO2 30 29 27 27 27   GLUCOSE 130* 116* 135* 111* 148*  BUN 7 6 7 9  5*  CREATININE 0.73 0.77 0.89 0.75 0.71  CALCIUM 8.0* 7.9* 7.6* 7.6* 8.1*  MG  --  2.0  --   --   --    GFR: Estimated Creatinine Clearance: 114.2 mL/min (by C-G formula based on SCr of 0.71 mg/dL). Liver Function Tests: Recent Labs  Lab 03/28/20 0909 03/29/20 0135 03/30/20 0959 03/31/20 0141 04/01/20 0833  AST 24 36 118* 163* 110*  ALT 44 47* 99* 155* 151*  ALKPHOS 75 66 65 75 77    BILITOT 1.9* 1.9* 1.7* 1.5* 0.7  PROT 6.5 6.2* 6.1* 6.0* 6.7  ALBUMIN 1.8* 1.6* 1.5* 1.5* 1.6*   Andrew results for input(s): LIPASE, AMYLASE in the last 168 hours. Andrew results for input(s): AMMONIA in the last 168 hours. Coagulation Profile: Recent Labs  Lab 03/28/20 0909  INR 1.3*   Cardiac Enzymes: Andrew results for input(s): CKTOTAL, CKMB, CKMBINDEX, TROPONINI in the last 168 hours.  BNP (last 3 results) Andrew results for input(s): PROBNP in the last 8760 hours. HbA1C: Andrew results for input(s): HGBA1C in the last 72 hours. CBG: Andrew results for input(s): GLUCAP in the last 168 hours. Lipid Profile: Andrew results for input(s): CHOL, HDL, LDLCALC, TRIG, CHOLHDL, LDLDIRECT in the last 72 hours. Thyroid Function Tests: Andrew results for input(s): TSH, T4TOTAL, FREET4, T3FREE, THYROIDAB in the last 72 hours. Anemia Panel: Recent Labs    03/30/20 0151 03/30/20 0959  VITAMINB12 784  --   FOLATE 14.9  --   FERRITIN 1,474*  --   TIBC 120*  --   IRON 11*  --   RETICCTPCT  --  1.9   Urine analysis: Andrew results found for: COLORURINE, APPEARANCEUR, LABSPEC, PHURINE, GLUCOSEU, HGBUR, BILIRUBINUR, KETONESUR, PROTEINUR, UROBILINOGEN, NITRITE, LEUKOCYTESUR Sepsis Labs: @LABRCNTIP (procalcitonin:4,lacticacidven:4)  ) Recent Results (from the past 240 hour(s))  Novel Coronavirus, NAA ([facility])     Status: None   Collection Time: 03/27/20  6:29 PM   Specimen: Nasopharyngeal Swab; Nasopharyngeal(NP) swabs in vial transport medium   Nasopharynge  Result Value Ref Range Status   SARS-CoV-2, NAA Not Detected Not Detected Final    Comment: This nucleic acid amplification test was developed and its performance characteristics determined by [employer]. Nucleic acid amplification tests include RT-PCR and TMA. This test has not been FDA cleared or approved. This test has been authorized by FDA under an Emergency Use Authorization (EUA). This test is only authorized for the duration of time the  declaration that circumstances exist justifying the authorization of the emergency use of in vitro diagnostic tests for detection of SARS-CoV-2 virus and/or diagnosis of COVID-Andrew [name] infection under section 564(b)(1) of the Act, 21 U.S.C. 329JME-2(Andrew [name]) (1), unless the authorization is terminated or revoked sooner. When diagnostic testing is negative, the possibility of Andrew [name] false negative result should be considered in the context of Andrew [name] Andrew [name]'s recent exposures and the presence of clinical signs and symptoms consistent with COVID-Andrew [name]. An individual without symptoms of COVID-Andrew [name] and who is not shedding SARS-CoV-2 virus wo uld expect to have Andrew [name] negative (not detected) result in this assay.   SARS-COV-2, NAA 2 DAY TAT     Status: None   Collection Time: 03/27/20  6:29 PM   Nasopharynge  Result Value Ref Range Status   SARS-CoV-2, NAA 2 DAY TAT Performed  Final  SARS Coronavirus 2 by RT PCR (hospital order, performed in [place] hospital lab) Nasopharyngeal Nasopharyngeal Swab     Status: None   Collection Time: 03/28/20  3:37 AM   Specimen: Nasopharyngeal Swab  Result Value Ref Range Status   SARS Coronavirus 2 NEGATIVE NEGATIVE Final    Comment: (NOTE) SARS-CoV-2 target nucleic acids are NOT DETECTED.  The SARS-CoV-2 RNA is generally detectable in upper and lower respiratory specimens during the acute phase of infection. The lowest concentration of SARS-CoV-2 viral copies this assay can detect is 250 copies / mL. Andrew [name] negative result does not preclude SARS-CoV-2 infection and should not be used as the sole basis for treatment or other Andrew [name] management decisions.  Andrew [name] negative result may occur with improper specimen collection / handling, submission of specimen other than nasopharyngeal swab, presence of viral mutation(s) within the areas targeted by this assay, and inadequate number of viral copies (<250 copies / mL). Andrew [name] negative result must be combined with clinical observations, Andrew [name]  history, and epidemiological information.  Fact Sheet for Patients:   StrictlyIdeas.Andrew  Fact Sheet for Healthcare Providers: BankingDealers.co.za  This test is not yet  approved or  cleared by the [place] and has been authorized for detection and/or diagnosis of SARS-CoV-2 by FDA under an Emergency Use Authorization (EUA).  This EUA will remain in effect (meaning this test can be used) for the duration of the COVID-Andrew [name] declaration under Section 564(b)(1) of the Act, 21 U.S.C. section 360bbb-3(b)(1), unless the authorization is terminated or revoked sooner.  Performed at [facility], [place] [address], [place], [place] [address]   Culture, blood (single)     Status: None (Preliminary result)   Collection Time: 03/28/20  5:57 AM   Specimen: BLOOD  Result Value Ref Range Status   Specimen Description BLOOD RIGHT ANTECUBITAL  Final   Special Requests   Final    BOTTLES DRAWN AEROBIC AND ANAEROBIC Blood Culture results may not be optimal due to an inadequate volume of blood received in culture bottles   Culture   Final    Andrew GROWTH 4 DAYS Performed at [facility], [place] [address] [place], [place] [address]    Report Status PENDING  Incomplete  Culture, blood (x 2)     Status: None (Preliminary result)   Collection Time: 03/28/20  9:09 AM   Specimen: BLOOD LEFT HAND  Result Value Ref Range Status   Specimen Description BLOOD LEFT HAND  Final   Special Requests   Final    BOTTLES DRAWN AEROBIC AND ANAEROBIC Blood Culture adequate volume   Culture   Final    Andrew GROWTH 4 DAYS Performed at [facility], [place] [address] [place], [place] [address]    Report Status PENDING  Incomplete  Culture, blood (x 2)     Status: None (Preliminary result)   Collection Time: 03/28/20 10:00 AM   Specimen: BLOOD  Result Value Ref Range Status   Specimen Description BLOOD RIGHT ANTECUBITAL  Final   Special Requests   Final     BOTTLES DRAWN AEROBIC AND ANAEROBIC Blood Culture adequate volume   Culture   Final    Andrew GROWTH 4 DAYS Performed at [facility], [place] [address], [place], [place] [address]    Report Status PENDING  Incomplete  Acid Fast Smear (AFB)     Status: None   Collection Time: 03/28/20 10:15 AM   Specimen: Sputum  Result Value Ref Range Status   AFB Specimen Processing Concentration  Final   Acid Fast Smear Negative  Final    Comment: (NOTE) Performed At: [facility] [place], [place] [id number] Rush Farmer MD MV:[id number]    Source (AFB) EXPECTORATED SPUTUM  Final    Comment: Performed at [facility], [place] [address] [place], [place] [address]  Expectorated sputum assessment w rflx to resp cult     Status: None   Collection Time: 03/28/20 10:15 AM   Specimen: Expectorated Sputum  Result Value Ref Range Status   Specimen Description EXPECTORATED SPUTUM  Final   Special Requests NONE  Final   Sputum evaluation   Final    THIS SPECIMEN IS ACCEPTABLE FOR SPUTUM CULTURE Performed at [facility], [place] [address] [place], [place] [address]    Report Status 03/29/2020 FINAL  Final  Culture, respiratory     Status: None   Collection Time: 03/28/20 10:15 AM  Result Value Ref Range Status   Specimen Description EXPECTORATED SPUTUM  Final   Special Requests NONE Reflexed from [id number]  Final   Gram Stain   Final  ABUNDANT WBC PRESENT,BOTH PMN AND MONONUCLEAR ABUNDANT GRAM POSITIVE COCCI MODERATE GRAM VARIABLE ROD FEW GRAM NEGATIVE RODS    Culture   Final    RARE Consistent with normal respiratory flora. Andrew Pseudomonas species isolated Performed at [facility] [address] [place], [place] [address]    Report Status 03/30/2020 FINAL  Final         Radiology Studies: DG CHEST PORT 1 VIEW  Result Date: 04/01/2020 CLINICAL DATA:  Follow-up known right-sided cavitary lesion EXAM: PORTABLE CHEST 1 VIEW COMPARISON:  03/28/2020 Andrew  FINDINGS: Cardiac shadow is stable. Left lung remains clear. Persistent consolidation in the right upper lobe is noted with apical cavitary lesion similar to that noted on the prior exam. Andrew new focal abnormality is noted. IMPRESSION: Stable right upper lobe infiltrate and cavitary lesion as described. Electronically Signed   By: Inez Catalina M.D.   On: 04/01/2020 10:42        Scheduled Meds:  enoxaparin (LOVENOX) injection  40 mg Subcutaneous J00X   folic acid  1 mg Oral Daily   guaiFENesin  600 mg Oral BID   lactobacillus acidophilus  2 tablet Oral TID   potassium chloride  40 mEq Oral BID   Continuous Infusions:  ampicillin-sulbactam (UNASYN) IV 3 g (04/01/20 1600)     LOS: 4 days    Time spent: 35 minutes    Vashti Hey, MD [facility] 04/01/2020, 7:05 PM     Please page though Harold or Epic secure chat:  For [employer], Adult nurse

## 2020-04-02 DIAGNOSIS — J189 Pneumonia, unspecified organism: Secondary | ICD-10-CM

## 2020-04-02 DIAGNOSIS — D473 Essential (hemorrhagic) thrombocythemia: Secondary | ICD-10-CM

## 2020-04-02 DIAGNOSIS — D72825 Bandemia: Secondary | ICD-10-CM

## 2020-04-02 LAB — CBC WITH DIFFERENTIAL/PLATELET
Abs Immature Granulocytes: 0.36 10*3/uL — ABNORMAL HIGH (ref 0.00–0.07)
Basophils Absolute: 0.1 10*3/uL (ref 0.0–0.1)
Basophils Relative: 0 %
Eosinophils Absolute: 0.1 10*3/uL (ref 0.0–0.5)
Eosinophils Relative: 0 %
HCT: 25.8 % — ABNORMAL LOW (ref 39.0–52.0)
Hemoglobin: 8.1 g/dL — ABNORMAL LOW (ref 13.0–17.0)
Immature Granulocytes: 1 %
Lymphocytes Relative: 15 %
Lymphs Abs: 4 10*3/uL (ref 0.7–4.0)
MCH: 27.6 pg (ref 26.0–34.0)
MCHC: 31.4 g/dL (ref 30.0–36.0)
MCV: 88.1 fL (ref 80.0–100.0)
Monocytes Absolute: 1.7 10*3/uL — ABNORMAL HIGH (ref 0.1–1.0)
Monocytes Relative: 6 %
Neutro Abs: 21.3 10*3/uL — ABNORMAL HIGH (ref 1.7–7.7)
Neutrophils Relative %: 78 %
Platelets: 819 10*3/uL — ABNORMAL HIGH (ref 150–400)
RBC: 2.93 MIL/uL — ABNORMAL LOW (ref 4.22–5.81)
RDW: 14.6 % (ref 11.5–15.5)
WBC: 27.5 10*3/uL — ABNORMAL HIGH (ref 4.0–10.5)
nRBC: 0.1 % (ref 0.0–0.2)

## 2020-04-02 LAB — ACID FAST SMEAR (AFB, MYCOBACTERIA): Acid Fast Smear: NEGATIVE

## 2020-04-02 LAB — CULTURE, BLOOD (ROUTINE X 2)
Culture: NO GROWTH
Culture: NO GROWTH
Special Requests: ADEQUATE
Special Requests: ADEQUATE

## 2020-04-02 LAB — BASIC METABOLIC PANEL
Anion gap: 10 (ref 5–15)
BUN: 7 mg/dL (ref 6–20)
CO2: 27 mmol/L (ref 22–32)
Calcium: 8.5 mg/dL — ABNORMAL LOW (ref 8.9–10.3)
Chloride: 99 mmol/L (ref 98–111)
Creatinine, Ser: 0.69 mg/dL (ref 0.61–1.24)
GFR calc Af Amer: >60 mL/min (ref >60)
GFR calc non Af Amer: >60 mL/min (ref >60)
Glucose, Bld: 124 mg/dL — ABNORMAL HIGH (ref 70–99)
Potassium: 3.8 mmol/L (ref 3.5–5.1)
Sodium: 136 mmol/L (ref 135–145)

## 2020-04-02 LAB — CULTURE, BLOOD (SINGLE): Culture: NO GROWTH

## 2020-04-02 MED ORDER — BACID PO TABS: 2.0000 | ORAL_TABLET | Freq: Two times a day (BID) | ORAL | 0 refills | Status: AC

## 2020-04-02 MED ORDER — AMOXICILLIN-POT CLAVULANATE 875-125 MG PO TABS
1.0000 | ORAL_TABLET | Freq: Two times a day (BID) | ORAL | 0 refills | Status: DC
Start: 2020-04-02 — End: 2020-04-02

## 2020-04-02 MED ORDER — AMOXICILLIN-POT CLAVULANATE 875-125 MG PO TABS: 1.0000 | ORAL_TABLET | Freq: Two times a day (BID) | ORAL | 0 refills | Status: AC

## 2020-04-02 MED ORDER — BACID PO TABS: 2.0000 | ORAL_TABLET | Freq: Two times a day (BID) | ORAL | 0 refills | Status: DC

## 2020-04-02 MED FILL — AMOX-CLAV 875-125 MG TABLET: 875-125 | 28 days supply | Qty: 56 | Fill #0

## 2020-04-02 MED FILL — FLORASTOR 250 MG CAPSULE: 250 | 13 days supply | Qty: 54 | Fill #0

## 2020-04-02 NOTE — Discharge Summary (Signed)
Physician Discharge Summary  Andrew [name] HYW:[id number] DOB: [birth year] DOA: 03/27/2020  PCP: Patient, No Pcp Per  Admit date: 03/27/2020 Discharge date: 04/02/2020  Admitted From: Home Disposition: Home  Recommendations for Outpatient Follow-up:  1. Pulmonology follow-up as below 2. ID to arrange follow-up in 2 to 3 weeks 3. Please obtain CBC/BMP/Mag at follow up 4. Please follow up on the following pending results: Acid-fast culture  Home Health: None Equipment/Devices: None  Discharge Condition: Stable CODE STATUS: Full code   Follow-up Information     [facility]. Schedule an appointment as soon as possible for a visit in 2 week(s).   Specialty: Pulmonology Contact information: [place] [facility] Course: 39 y.o. M with history of alcohol and marijuana use who presented on 03/27/2020 with about a week of fever, chest/back pain and cough.  In the ED, CT imaging showed a large cavitary pneumonia.   In ED, febrile to 100.8.  Tachycardic to 140.  WBC 31.6.  Hgb 9.9.  D-dimer 3.78.  COVID-19 PCR negative.  CTA chest negative for PE but large-scale cavitation of the right lung with multifocal groundglass opacity in RUL and RML concerning for necrotizing pneumonia, and biapical bullae.  Cultures obtained.  Started on on cefepime and vancomycin.  Admitted for sepsis due to necrotizing pneumonia.  Infectious disease and pulmonology consulted.    Blood cultures negative. Patient was transitioned to IV Unasyn the next day. Urine strep and Legionella antigen negative.  Expectorated sputum culture with normal respiratory flora.  AFB smear was negative.  AFB culture was pending.    On the day of discharge, patient felt well and ready to go home.  ID recommended additional 4 weeks of p.o. Augmentin.  Patient to follow-up with pulmonology as above.  ID to arrange outpatient follow-up in 2 to 3 weeks.   Patient has been counseled against marijuana smoking.   See individual problem list below for more hospital course.  Discharge Diagnoses:  Sepsis from cavitary pneumonia -Vancomycin plus cefepime on 8/5> Unasyn 8/6-8/11> Augmentin for additional 4 weeks per ID, Dr. Baxter Flattery -ID to arrange outpatient follow-up in 2 to 3 weeks -Outpatient follow-up with pulmonology as above  Leukocytosis/bandemia-leukemoid reaction?  Improved -Recheck CBC at follow-up.  If leukocytosis persists, recommend hematology referral  Hypokalemia: Resolved  Hyponatremia: Resolved  Iron deficiency anemia/thrombocytopenia-Hgb dropped to 6.8.  Transfused 1 unit with appropriate response.  Also received IV iron -Recommend p.o. iron once infection is adequately treated.  Transaminitis: Likely due to #1.  Improved.  Viral serologies negative. -Recheck CMP at follow-up   Marijuana use -Counseled to quit.  Sinus tachycardia: Likely due to anemia Body mass index is 22.05 kg/m.            Discharge Exam: Vitals:   04/01/20 2017 04/02/20 0753  BP: 131/84 135/80  Pulse: (!) 117 (!) 110  Resp: 16 18  Temp: 99.1 F (37.3 C) 98.4 F (36.9 C)  SpO2: 100% 100%    GENERAL: No apparent distress.  Ambulating in the room. HEENT: MMM.  Vision and hearing grossly intact.  NECK: Supple.  No apparent JVD.  RESP: On room air.  No IWOB.  Fair aeration bilaterally. CVS:  RRR. Heart sounds normal.  ABD/GI/GU: Bowel sounds present. Soft. Non tender.  MSK/EXT:  Moves extremities. No apparent deformity. No edema.  SKIN: no apparent skin lesion or wound NEURO: Awake, alert and  oriented appropriately.  No apparent focal neuro deficit. PSYCH: Calm. Normal affect.  Discharge Instructions  Discharge Instructions    Call MD for:  difficulty breathing, headache or visual disturbances   Complete by: As directed    Call MD for:  extreme fatigue   Complete by: As directed    Call MD for:  persistant dizziness or  light-headedness   Complete by: As directed    Call MD for:  persistant nausea and vomiting   Complete by: As directed    Call MD for:  severe uncontrolled pain   Complete by: As directed    Call MD for:  temperature >100.4   Complete by: As directed    Diet - low sodium heart healthy   Complete by: As directed    Discharge instructions   Complete by: As directed    It has been a pleasure taking care of you!  You were hospitalized due to pneumonia.  You were treated with IV antibiotics, and your symptoms improved.  We are discharging you on oral antibiotics to complete treatment course.  It is very important that you complete the whole course of antibiotic regardless of how you feel to treat this infection completely.  We also recommend you quit smoking marijuana which could affect your lung.  Please follow-up with the pulmonologist (lung doctors) and infectious disease.  You may have to make a phone call as soon as possible to schedule this follow-ups.   Take care,   Increase activity slowly   Complete by: As directed      Allergies as of 04/02/2020   No Known Allergies     Medication List    TAKE these medications   amoxicillin-clavulanate 875-125 MG tablet Commonly known as: Augmentin Take 1 tablet by mouth 2 (two) times daily for 28 days.   lactobacillus acidophilus Tabs tablet Take 2 tablets by mouth 2 (two) times daily.       Consultations:  Infectious disease  Pulmonology  Procedures/Studies:   DG Orthopantogram  Result Date: 03/28/2020 CLINICAL DATA:  Poor dentition EXAM: ORTHOPANTOGRAM/PANORAMIC COMPARISON:  None. FINDINGS: Mandible intact without fracture. Prominent dental carie of the posterior right mandibular molar as well as at the second maxillary molar on the left. Small radicular cysts are also noted at these locations. IMPRESSION: Odontogenic disease with prominent dental carie involving the posterior right mandibular molar as well as at the second  maxillary molar on the left with associated radicular cysts. Electronically Signed   By: Davina Poke D.O.   On: 03/28/2020 13:11   CT Angio Chest PE W and/or Wo Contrast  Result Date: 03/28/2020 CLINICAL DATA:  Chest pain that worsens with breathing EXAM: CT ANGIOGRAPHY CHEST WITH CONTRAST TECHNIQUE: Multidetector CT imaging of the chest was performed using the standard protocol during bolus administration of intravenous contrast. Multiplanar CT image reconstructions and MIPs were obtained to evaluate the vascular anatomy. CONTRAST:  161mL OMNIPAQUE IOHEXOL 350 MG/ML SOLN COMPARISON:  None. FINDINGS: Cardiovascular: Satisfactory opacification of the pulmonary arteries to the segmental level. No evidence of pulmonary embolism. Normal heart size. No pericardial effusion. Mediastinum/Nodes: No enlarged mediastinal, hilar, or axillary lymph nodes. Thyroid gland, trachea, and esophagus demonstrate no significant findings. Lungs/Pleura: There are biapical bullae. Much of the right lung is necrotic and filled with free air and fluid. There is extensive ground-glass opacity in the right upper and middle lobes. The left lung is relatively normal aside from multiple medial bullae. Upper Abdomen: No acute abnormality. Musculoskeletal: No chest  wall abnormality. No acute or significant osseous findings. Review of the MIP images confirms the above findings. IMPRESSION: 1. No pulmonary embolism. 2. Large scale cavitation of the right lung, with multifocal ground-glass opacity in the right upper and middle lobes. This likely indicates necrotizing pneumonia. 3. Biapical bullae. Electronically Signed   By: Ulyses Jarred M.D.   On: 03/28/2020 02:47   DG CHEST PORT 1 VIEW  Result Date: 04/01/2020 CLINICAL DATA:  Follow-up known right-sided cavitary lesion EXAM: PORTABLE CHEST 1 VIEW COMPARISON:  03/28/2020 CT FINDINGS: Cardiac shadow is stable. Left lung remains clear. Persistent consolidation in the right upper lobe is  noted with apical cavitary lesion similar to that noted on the prior exam. No new focal abnormality is noted. IMPRESSION: Stable right upper lobe infiltrate and cavitary lesion as described. Electronically Signed   By: Inez Catalina M.D.   On: 04/01/2020 10:42        The results of significant diagnostics from this hospitalization (including imaging, microbiology, ancillary and laboratory) are listed below for reference.     Microbiology: Recent Results (from the past 240 hour(s))  Novel Coronavirus, NAA ([facility])     Status: None   Collection Time: 03/27/20  6:29 PM   Specimen: Nasopharyngeal Swab; Nasopharyngeal(NP) swabs in vial transport medium   Nasopharynge  Result Value Ref Range Status   SARS-CoV-2, NAA Not Detected Not Detected Final    Comment: This nucleic acid amplification test was developed and its performance characteristics determined by [employer]. Nucleic acid amplification tests include RT-PCR and TMA. This test has not been FDA cleared or approved. This test has been authorized by FDA under an Emergency Use Authorization (EUA). This test is only authorized for the duration of time the declaration that circumstances exist justifying the authorization of the emergency use of in vitro diagnostic tests for detection of SARS-CoV-2 virus and/or diagnosis of COVID-19 infection under section 564(b)(1) of the Act, 21 U.S.C. 841YSA-6(T) (1), unless the authorization is terminated or revoked sooner. When diagnostic testing is negative, the possibility of a false negative result should be considered in the context of a patient's recent exposures and the presence of clinical signs and symptoms consistent with COVID-19. An individual without symptoms of COVID-19 and who is not shedding SARS-CoV-2 virus wo uld expect to have a negative (not detected) result in this assay.   SARS-COV-2, NAA 2 DAY TAT     Status: None   Collection Time: 03/27/20  6:29 PM    Nasopharynge  Result Value Ref Range Status   SARS-CoV-2, NAA 2 DAY TAT Performed  Final  SARS Coronavirus 2 by RT PCR (hospital order, performed in [facility] hospital lab) Nasopharyngeal Nasopharyngeal Swab     Status: None   Collection Time: 03/28/20  3:37 AM   Specimen: Nasopharyngeal Swab  Result Value Ref Range Status   SARS Coronavirus 2 NEGATIVE NEGATIVE Final    Comment: (NOTE) SARS-CoV-2 target nucleic acids are NOT DETECTED.  The SARS-CoV-2 RNA is generally detectable in upper and lower respiratory specimens during the acute phase of infection. The lowest concentration of SARS-CoV-2 viral copies this assay can detect is 250 copies / mL. A negative result does not preclude SARS-CoV-2 infection and should not be used as the sole basis for treatment or other patient management decisions.  A negative result may occur with improper specimen collection / handling, submission of specimen other than nasopharyngeal swab, presence of viral mutation(s) within the areas targeted by this assay, and inadequate number of  viral copies (<250 copies / mL). A negative result must be combined with clinical observations, patient history, and epidemiological information.  Fact Sheet for Patients:   StrictlyIdeas.no  Fact Sheet for Healthcare Providers: BankingDealers.co.za  This test is not yet approved or  cleared by the [place] FDA and has been authorized for detection and/or diagnosis of SARS-CoV-2 by FDA under an Emergency Use Authorization (EUA).  This EUA will remain in effect (meaning this test can be used) for the duration of the COVID-19 declaration under Section 564(b)(1) of the Act, 21 U.S.C. section 360bbb-3(b)(1), unless the authorization is terminated or revoked sooner.  Performed at [facility], [place] [address], [place], [place] [address]   Culture, blood (single)     Status: None   Collection Time: 03/28/20  5:57  AM   Specimen: BLOOD  Result Value Ref Range Status   Specimen Description BLOOD RIGHT ANTECUBITAL  Final   Special Requests   Final    BOTTLES DRAWN AEROBIC AND ANAEROBIC Blood Culture results may not be optimal due to an inadequate volume of blood received in culture bottles   Culture   Final    NO GROWTH 5 DAYS Performed at [facility], [place] [address] [place], [place] [address]    Report Status 04/02/2020 FINAL  Final  Culture, blood (x 2)     Status: None   Collection Time: 03/28/20  9:09 AM   Specimen: BLOOD LEFT HAND  Result Value Ref Range Status   Specimen Description BLOOD LEFT HAND  Final   Special Requests   Final    BOTTLES DRAWN AEROBIC AND ANAEROBIC Blood Culture adequate volume   Culture   Final    NO GROWTH 5 DAYS Performed at [facility], [place] [address], [place], [place] [address]    Report Status 04/02/2020 FINAL  Final  Culture, blood (x 2)     Status: None   Collection Time: 03/28/20 10:00 AM   Specimen: BLOOD  Result Value Ref Range Status   Specimen Description BLOOD RIGHT ANTECUBITAL  Final   Special Requests   Final    BOTTLES DRAWN AEROBIC AND ANAEROBIC Blood Culture adequate volume   Culture   Final    NO GROWTH 5 DAYS Performed at [facility], [place] [address] [place], [place] [address]    Report Status 04/02/2020 FINAL  Final  Acid Fast Smear (AFB)     Status: None   Collection Time: 03/28/20 10:15 AM   Specimen: Sputum  Result Value Ref Range Status   AFB Specimen Processing Concentration  Final   Acid Fast Smear Negative  Final    Comment: (NOTE) Performed At: [facility] [place], [place] [id number] Rush Farmer MD HT:[id number]    Source (AFB) EXPECTORATED SPUTUM  Final    Comment: Performed at [facility], [place] [address] [place], [place] [address]  Expectorated sputum assessment w rflx to resp cult     Status: None   Collection Time: 03/28/20 10:15 AM   Specimen: Expectorated  Sputum  Result Value Ref Range Status   Specimen Description EXPECTORATED SPUTUM  Final   Special Requests NONE  Final   Sputum evaluation   Final    THIS SPECIMEN IS ACCEPTABLE FOR SPUTUM CULTURE Performed at [facility], [place] [address] [place], [place] [address]    Report Status 03/29/2020 FINAL  Final  Culture, respiratory     Status: None  Collection Time: 03/28/20 10:15 AM  Result Value Ref Range Status   Specimen Description EXPECTORATED SPUTUM  Final   Special Requests NONE Reflexed from [id number]  Final   Gram Stain   Final    ABUNDANT WBC PRESENT,BOTH PMN AND MONONUCLEAR ABUNDANT GRAM POSITIVE COCCI MODERATE GRAM VARIABLE ROD FEW GRAM NEGATIVE RODS    Culture   Final    RARE Consistent with normal respiratory flora. No Pseudomonas species isolated Performed at [facility] [address] [place], [place] [address]    Report Status 03/30/2020 FINAL  Final  Acid Fast Smear (AFB)     Status: None   Collection Time: 03/30/20  5:00 AM   Specimen: Sputum  Result Value Ref Range Status   AFB Specimen Processing Concentration  Final   Acid Fast Smear Negative  Final    Comment: (NOTE) Performed At: [facility] [address] [place], [place] [id number] Rush Farmer MD LK:[id number]    Source (AFB) SPUTUM  Final    Comment: Performed at [facility], [place] [address], [place], [place] [address]     Labs: BNP (last 3 results) No results for input(s): BNP in the last 8760 hours. Basic Metabolic Panel: Recent Labs  Lab 03/29/20 0135 03/30/20 0151 03/31/20 0141 04/01/20 0833 04/02/20 0729  NA 135 133* 134* 136 136  K 3.2* 3.1* 3.4* 3.2* 3.8  CL 95* 95* 99 98 99  CO2 29 27 27 27 27   GLUCOSE 116* 135* 111* 148* 124*  BUN 6 7 9  5* 7  CREATININE 0.77 0.89 0.75 0.71 0.69  CALCIUM 7.9* 7.6* 7.6* 8.1* 8.5*  MG 2.0  --   --   --   --    Liver Function Tests: Recent Labs  Lab 03/28/20 0909 03/29/20 0135 03/30/20 0959 03/31/20 0141  04/01/20 0833  AST 24 36 118* 163* 110*  ALT 44 47* 99* 155* 151*  ALKPHOS 75 66 65 75 77  BILITOT 1.9* 1.9* 1.7* 1.5* 0.7  PROT 6.5 6.2* 6.1* 6.0* 6.7  ALBUMIN 1.8* 1.6* 1.5* 1.5* 1.6*   No results for input(s): LIPASE, AMYLASE in the last 168 hours. No results for input(s): AMMONIA in the last 168 hours. CBC: Recent Labs  Lab 03/28/20 0909 03/28/20 0909 03/29/20 0135 03/29/20 0135 03/30/20 0151 03/30/20 1947 03/31/20 0141 04/01/20 0833 04/02/20 0729  WBC 28.8*   < > 24.8*  --  28.2*  --  27.2* 33.5* 27.5*  NEUTROABS 23.3*  --  18.1*  --   --   --   --   --  21.3*  HGB 8.2*   < > 7.8*   < > 6.8* 8.4* 7.7* 7.7* 8.1*  HCT 24.6*   < > 23.8*   < > 21.2* 26.0* 23.4* 23.9* 25.8*  MCV 82.6   < > 82.6  --  85.1  --  83.6 86.0 88.1  PLT 646*   < > 673*  --  666*  --  640* 735* 819*   < > = values in this interval not displayed.   Cardiac Enzymes: No results for input(s): CKTOTAL, CKMB, CKMBINDEX, TROPONINI in the last 168 hours. BNP: Invalid input(s): POCBNP CBG: No results for input(s): GLUCAP in the last 168 hours. D-Dimer No results for input(s): DDIMER in the last 72 hours. Hgb A1c No results for input(s): HGBA1C in the last 72 hours. Lipid Profile No results for input(s): CHOL, HDL, LDLCALC, TRIG, CHOLHDL, LDLDIRECT in the last 72 hours. Thyroid function  studies No results for input(s): TSH, T4TOTAL, T3FREE, THYROIDAB in the last 72 hours.  Invalid input(s): FREET3 Anemia work up No results for input(s): VITAMINB12, FOLATE, FERRITIN, TIBC, IRON, RETICCTPCT in the last 72 hours. Urinalysis No results found for: COLORURINE, APPEARANCEUR, LABSPEC, PHURINE, GLUCOSEU, HGBUR, BILIRUBINUR, KETONESUR, PROTEINUR, UROBILINOGEN, NITRITE, LEUKOCYTESUR Sepsis Labs Invalid input(s): PROCALCITONIN,  WBC,  LACTICIDVEN   Time coordinating discharge: 35 minutes  SIGNED:  Mercy Riding, MD  [facility] 04/02/2020, 2:13 PM  If 7PM-7AM, please contact  night-coverage [email or web address] Password [email or web address]

## 2020-04-02 NOTE — TOC Progression Note (Signed)
Transition of Care [facility]) - Progression Note    Patient Details  Name: Andrew [name] MRN: [id number] Date of Birth: May 15, 1981  Transition of [facility] CM/SW Contact  Angelita Ingles, RN Phone Number: [phone]  04/02/2020, 2:51 PM  Clinical Narrative:    CM consulted for patient with no medical insurance or primary MD. CM has completed MATCH form for 30 day supply of medications. MD has been made aware and meds to be sent to [facility]. Appointment has been made with [facility]. Dates have been added to the AVS. Patient has been updated and made aware of meds and appointment. No further needs noted at this time. CM will sign off.         Expected Discharge Plan and Services           Expected Discharge Date: 04/02/20                                     Social Determinants of Health (SDOH) Interventions    Readmission Risk Interventions No flowsheet data found.

## 2020-04-02 NOTE — Telephone Encounter (Signed)
Appointment made per Dr. Silas Flood prior to discharge  CT ordered please schedule 2-3 days prior to this appointment please.   Nothing further needed at this time. Will route to [facility] as fyi

## 2020-04-02 NOTE — Progress Notes (Signed)
Acid Fast Smear from 8/8-negative

## 2020-04-23 NOTE — Progress Notes (Signed)
Patient ID: Andrew [name], male   DOB: 10/29/1980, 39 y.o.   MRN: [id number]     Andrew [name], is a 39 y.o. male  PFX:[id number]  HGD:[id number]  DOB - [birth year]  Subjective:  Chief Complaint and HPI: Andrew [name] is a 39 y.o. male here today to establish care and for a follow up visit  After hospitalization 8/5-8/06/2020.  He is doing great.  He feels almost back to 100%.  He has about 5-7 days of antibiotics left.  No fevers.  Energy levels are good.  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  No SOB or CP.  No further alcohol or marijuana use  From discharge summary: Hospital Course: 39 y.o.M with history of alcohol and marijuana usewho presentedon 8/5/2021with about a week offever,chest/back pain and cough.In the ED,CT imaging showed a large cavitary pneumonia.   In ED, febrile to 100.8.  Tachycardic to 140.  WBC 31.6.  Hgb 9.9.  D-dimer 3.78.  COVID-19 PCR negative.  CTA chest negative for PE but large-scale cavitation of the right lung with multifocal groundglass opacity in RUL and RML concerning for necrotizing pneumonia, and biapical bullae.  Cultures obtained.  Started on on cefepime and vancomycin.  Admitted for sepsis due to necrotizing pneumonia.  Infectious disease and pulmonology consulted.    Blood cultures negative. Patient was transitioned to IV Unasyn the next day. Urine strep and Legionella antigen negative.  Expectorated sputum culture with normal respiratory flora.  AFB smear was negative.  AFB culture was pending.    On the day of discharge, patient felt well and ready to go home.  ID recommended additional 4 weeks of p.o. Augmentin.  Patient to follow-up with pulmonology as above.  ID to arrange outpatient follow-up in 2 to 3 weeks.  Patient has been counseled against marijuana smoking.   See individual problem list below for more hospital course.  Discharge Diagnoses:  Sepsis from  cavitary pneumonia -Vancomycin plus cefepime on 8/5> Unasyn 8/6-8/11> Augmentin for additional 4 weeks per ID, Dr. Baxter Flattery -ID to arrange outpatient follow-up in 2 to 3 weeks -Outpatient follow-up with pulmonology as above  Leukocytosis/bandemia-leukemoid reaction?  Improved -Recheck CBC at follow-up.  If leukocytosis persists, recommend hematology referral  Hypokalemia: Resolved  Hyponatremia: Resolved  Iron deficiency anemia/thrombocytopenia-Hgb dropped to 6.8.  Transfused 1 unit with appropriate response.  Also received IV iron -Recommend p.o. iron once infection is adequately treated.  Transaminitis: Likely due to #1.  Improved.  Viral serologies negative. -Recheck CMP at follow-up  Marijuana use -Counseled to quit.  Sinus tachycardia: Likely due to anemia Body mass index is 22.05 kg/m.  ED/Hospital notes reviewed.   Social History: Family history:  ROS:   Constitutional:  No f/c, No night sweats, No unexplained weight loss. EENT:  No vision changes, No blurry vision, No hearing changes. No mouth, throat, or ear problems.  Respiratory: No cough, No SOB Cardiac: No CP, no palpitations GI:  No abd pain, No N/V/D. GU: No Urinary s/sx Musculoskeletal: No joint pain Neuro: No headache, no dizziness, no motor weakness.  Skin: No rash Endocrine:  No polydipsia. No polyuria.  Psych: Denies SI/HI  No problems updated.  ALLERGIES: No Known Allergies  PAST MEDICAL HISTORY: History reviewed. No pertinent past medical history.  MEDICATIONS AT HOME: Prior to Admission medications   Medication Sig Start Date End Date Taking? Authorizing Provider  amoxicillin-clavulanate (AUGMENTIN) 875-125 MG tablet Take 1 tablet  by mouth 2 (two) times daily for 28 days. 04/02/20 04/30/20  Mercy Riding, MD  lactobacillus acidophilus (BACID) TABS tablet Take 2 tablets by mouth 2 (two) times daily. 04/02/20   Mercy Riding, MD     Objective:  EXAM:   Vitals:   04/24/20 0911  BP:  138/84  Pulse: 72  Resp: 16  Temp: 98.4 F (36.9 C)  SpO2: 99%  Weight: 150 lb (68 kg)  Height: 5\' 8"  (1.727 m)    General appearance : A&OX3. NAD. Non-toxic-appearing HEENT: Atraumatic and Normocephalic.  Chest/Lungs:  Breathing-non-labored, Good air entry bilaterally, breath sounds normal without rales, rhonchi, or wheezing  CVS: S1 S2 regular, no murmurs, gallops, rubs . Extremities: Bilateral Lower Ext shows no edema, both legs are warm to touch with = pulse throughout Neurology:  CN II-XII grossly intact, Non focal.   Psych:  TP linear. J/I WNL. Normal speech. Appropriate eye contact and affect.  Skin:  No Rash  Data Review No results found for: HGBA1C   Assessment & Plan   1. Necrotizing pneumonia ([name]) Resolving-finish antibiotics and f/up with CT and pulmonology as planned.   - Comprehensive metabolic panel - CBC with Differential/Platelet - Magnesium  2. Hyponatremia - Comprehensive metabolic panel - Magnesium  3. Hypokalemia - Comprehensive metabolic panel - Magnesium  4. Substance abuse ([place]) I have counseled the patient at length about substance abuse and addiction.  12 step meetings/recovery recommended.  Local 12 step meeting lists were given and attendance was encouraged.  Patient expresses understanding.    5. Anemia, unspecified type - Comprehensive metabolic panel - CBC with Differential/Platelet  6. Hospital discharge follow-up Doing well!!  Patient have been counseled extensively about nutrition and exercise  Return in about 2 months (around 06/24/2020) for assign PCP/establish care.  The patient was given clear instructions to go to ER or return to medical center if symptoms don't improve, worsen or new problems develop. The patient verbalized understanding. The patient was told to call to get lab results if they haven't heard anything in the next week.     Andrew Caldron, PA-C [facility] and [facility] [place],  [place]   04/24/2020, 9:36 AM

## 2020-04-24 ENCOUNTER — Encounter: Payer: Self-pay | Admitting: Physician Assistant

## 2020-04-24 ENCOUNTER — Ambulatory Visit: Payer: Self-pay | Attending: Physician Assistant | Admitting: Physician Assistant

## 2020-04-24 ENCOUNTER — Other Ambulatory Visit: Payer: Self-pay

## 2020-04-24 VITALS — BP 138/84 | HR 72 | Temp 98.4°F | Resp 16 | Ht 68.0 in | Wt 150.0 lb

## 2020-04-24 DIAGNOSIS — D649 Anemia, unspecified: Secondary | ICD-10-CM

## 2020-04-24 DIAGNOSIS — Z09 Encounter for follow-up examination after completed treatment for conditions other than malignant neoplasm: Secondary | ICD-10-CM

## 2020-04-24 DIAGNOSIS — E871 Hypo-osmolality and hyponatremia: Secondary | ICD-10-CM

## 2020-04-24 DIAGNOSIS — F1910 Other psychoactive substance abuse, uncomplicated: Secondary | ICD-10-CM

## 2020-04-24 DIAGNOSIS — J850 Gangrene and necrosis of lung: Secondary | ICD-10-CM

## 2020-04-24 DIAGNOSIS — E876 Hypokalemia: Secondary | ICD-10-CM

## 2020-04-24 NOTE — Patient Instructions (Signed)
[place] and [place] are websites for alcohol and substance abuse should you need additional support

## 2020-04-25 LAB — COMPREHENSIVE METABOLIC PANEL
ALT: 20 IU/L (ref 0–44)
AST: 13 IU/L (ref 0–40)
Albumin/Globulin Ratio: 1.1 — ABNORMAL LOW (ref 1.2–2.2)
Albumin: 4.1 g/dL (ref 4.0–5.0)
Alkaline Phosphatase: 91 IU/L (ref 48–121)
BUN/Creatinine Ratio: 10 (ref 9–20)
BUN: 8 mg/dL (ref 6–24)
Bilirubin Total: 0.5 mg/dL (ref 0.0–1.2)
CO2: 28 mmol/L (ref 20–29)
Calcium: 10.1 mg/dL (ref 8.7–10.2)
Chloride: 101 mmol/L (ref 96–106)
Creatinine, Ser: 0.77 mg/dL (ref 0.76–1.27)
GFR calc Af Amer: 131 mL/min/{1.73_m2} (ref >59)
GFR calc non Af Amer: 114 mL/min/{1.73_m2} (ref >59)
Globulin, Total: 3.6 g/dL (ref 1.5–4.5)
Glucose: 71 mg/dL (ref 65–99)
Potassium: 3.8 mmol/L (ref 3.5–5.2)
Sodium: 142 mmol/L (ref 134–144)
Total Protein: 7.7 g/dL (ref 6.0–8.5)

## 2020-04-25 LAB — CBC WITH DIFFERENTIAL/PLATELET
Basophils Absolute: 0.1 10*3/uL (ref 0.0–0.2)
Basos: 1 %
EOS (ABSOLUTE): 0.4 10*3/uL (ref 0.0–0.4)
Eos: 3 %
Hematocrit: 38.1 % (ref 37.5–51.0)
Hemoglobin: 11.9 g/dL — ABNORMAL LOW (ref 13.0–17.7)
Immature Grans (Abs): 0 10*3/uL (ref 0.0–0.1)
Immature Granulocytes: 0 %
Lymphocytes Absolute: 3.6 10*3/uL — ABNORMAL HIGH (ref 0.7–3.1)
Lymphs: 34 %
MCH: 27.4 pg (ref 26.6–33.0)
MCHC: 31.2 g/dL — ABNORMAL LOW (ref 31.5–35.7)
MCV: 88 fL (ref 79–97)
Monocytes Absolute: 0.9 10*3/uL (ref 0.1–0.9)
Monocytes: 8 %
Neutrophils Absolute: 5.9 10*3/uL (ref 1.4–7.0)
Neutrophils: 54 %
Platelets: 373 10*3/uL (ref 150–450)
RBC: 4.35 x10E6/uL (ref 4.14–5.80)
RDW: 13 % (ref 11.6–15.4)
WBC: 10.8 10*3/uL (ref 3.4–10.8)

## 2020-04-25 LAB — MAGNESIUM: Magnesium: 1.7 mg/dL (ref 1.6–2.3)

## 2020-05-02 ENCOUNTER — Ambulatory Visit (INDEPENDENT_AMBULATORY_CARE_PROVIDER_SITE_OTHER)
Admission: RE | Admit: 2020-05-02 | Discharge: 2020-05-02 | Disposition: A | Discharge status: Home / Self Care | Payer: Self-pay | Source: Ambulatory Visit | Attending: Pulmonary Disease | Admitting: Pulmonary Disease

## 2020-05-02 ENCOUNTER — Other Ambulatory Visit: Payer: Self-pay

## 2020-05-02 DIAGNOSIS — J984 Other disorders of lung: Secondary | ICD-10-CM

## 2020-05-02 DIAGNOSIS — J189 Pneumonia, unspecified organism: Secondary | ICD-10-CM

## 2020-05-07 ENCOUNTER — Ambulatory Visit (INDEPENDENT_AMBULATORY_CARE_PROVIDER_SITE_OTHER): Payer: Self-pay | Admitting: Pulmonary Disease

## 2020-05-07 ENCOUNTER — Encounter: Payer: Self-pay | Admitting: Pulmonary Disease

## 2020-05-07 ENCOUNTER — Other Ambulatory Visit: Payer: Self-pay

## 2020-05-07 VITALS — BP 112/60 | HR 98 | Temp 97.6°F | Ht 68.0 in | Wt 158.8 lb

## 2020-05-07 DIAGNOSIS — J984 Other disorders of lung: Secondary | ICD-10-CM

## 2020-05-07 DIAGNOSIS — J189 Pneumonia, unspecified organism: Secondary | ICD-10-CM

## 2020-05-07 NOTE — Progress Notes (Signed)
@Patient  ID: Andrew [name], male    DOB: [birth year], 39 y.o.   MRN: [id number]  Chief Complaint  Patient presents with   Hospitalization Follow-up    post pneumonia, no current symptoms    Referring provider: No ref. provider found  HPI:  39 year old man with former marijuana smoker and alcohol use who was admitted to the hospital with cavitary pneumonia thought to be due to aspiration.  PMH:  Smoker/ Smoking History:  Maintenance:   Pt of:   05/07/2020  - Visit   Patient was in clinic after completing 4 weeks of Augmentin for cavitary pneumonia.  He feels better.  Chest x-ray is improved.  Fatigue is improved.  No dyspnea exertion.  No cough.  Took every dose of Augmentin as prescribed.  CT scan earlier this week personally reviewed interpreted as marked improvement in dense consolidation and air-fluid levels and bullae compared to prior scan.  Questionaires / Pulmonary Flowsheets:   ACT:  No flowsheet data found.  MMRC: No flowsheet data found.  Epworth:  No flowsheet data found.  Tests:   FENO:  No results found for: NITRICOXIDE  PFT: No flowsheet data found.  WALK:  No flowsheet data found.  Imaging: CT Chest Wo Contrast  Result Date: 05/03/2020 CLINICAL DATA:  Follow-up cavitary pneumonia involving the RIGHT lung. EXAM: CT CHEST WITHOUT CONTRAST TECHNIQUE: Multidetector CT imaging of the chest was performed following the standard protocol without IV contrast. COMPARISON:  03/28/2020. FINDINGS: Cardiovascular: Stable normal heart size. No visible coronary atherosclerosis. No pericardial effusion. No visible aortic atherosclerosis. Mediastinum/Nodes: No pathologic lymphadenopathy, allowing for the unenhanced technique. The previously identified enlarged RIGHT hilar lymph nodes have decreased in size in the interval. Normal appearing esophagus. Normal-appearing thyroid gland. Lungs/Pleura: Interval marked improvement in the cavitary pneumonia in the RIGHT UPPER  LOBE and superior segment RIGHT LOWER LOBE since the prior CT 1 month ago. There are residual peripheral airspace opacities in the RIGHT UPPER LOBE, with residual cavitation involving the consolidation in the posterior RIGHT UPPER LOBE. No new pulmonary parenchymal abnormalities in either lung. No parenchymal nodules or masses. Severe bullous emphysematous changes are present throughout the upper lobes of both lungs. No pleural effusions. Central airways patent without significant bronchial wall thickening. Upper Abdomen: Unremarkable for the unenhanced technique. Musculoskeletal: Regional skeleton unremarkable without acute or significant osseous abnormality. IMPRESSION: 1. Interval marked improvement in the cavitary pneumonia involving the RIGHT UPPER LOBE and superior segment RIGHT LOWER LOBE since the prior CT 1 month ago. There are residual peripheral airspace opacities in the RIGHT UPPER LOBE, with residual cavitation involving the consolidation in the posterior RIGHT UPPER LOBE. 2. No new pulmonary parenchymal abnormalities in either lung. 3. Interval decrease in size of the previously identified RIGHT hilar lymph nodes, confirming benignity. Emphysema (ICD10-J43.9). Electronically Signed   By: Evangeline Dakin M.D.   On: 05/03/2020 18:32    Lab Results: Reviewed and as per EMR CBC    Component Value Date/Time   WBC 10.8 04/24/2020 0932   WBC 27.5 (H) 04/02/2020 0729   RBC 4.35 04/24/2020 0932   RBC 2.93 (L) 04/02/2020 0729   HGB 11.9 (L) 04/24/2020 0932   HCT 38.1 04/24/2020 0932   PLT 373 04/24/2020 0932   MCV 88 04/24/2020 0932   MCH 27.4 04/24/2020 0932   MCH 27.6 04/02/2020 0729   MCHC 31.2 (L) 04/24/2020 0932   MCHC 31.4 04/02/2020 0729   RDW 13.0 04/24/2020 0932   LYMPHSABS 3.6 (H) 04/24/2020 0932  MONOABS 1.7 (H) 04/02/2020 0729   EOSABS 0.4 04/24/2020 0932   BASOSABS 0.1 04/24/2020 0932    BMET    Component Value Date/Time   NA 142 04/24/2020 0932   K 3.8 04/24/2020  0932   CL 101 04/24/2020 0932   CO2 28 04/24/2020 0932   GLUCOSE 71 04/24/2020 0932   GLUCOSE 124 (H) 04/02/2020 0729   BUN 8 04/24/2020 0932   CREATININE 0.77 04/24/2020 0932   CALCIUM 10.1 04/24/2020 0932   GFRNONAA 114 04/24/2020 0932   GFRAA 131 04/24/2020 0932    BNP No results found for: BNP  ProBNP No results found for: PROBNP  Specialty Problems    None      No Known Allergies  Immunization History  Administered Date(s) Administered   Moderna SARS-COVID-2 Vaccination 04/28/2020    History reviewed. No pertinent past medical history.  Tobacco History: Social History   Tobacco Use  Smoking Status Former Smoker   Years: 22.00   Types: Cigars   Start date: 1999   Quit date: 03/27/2020   Years since quitting: 0.1  Smokeless Tobacco Never Used  Tobacco Comment   quit for 2 years between did smoke 3 cigars a day    Counseling given: Not Answered Comment: quit for 2 years between did smoke 3 cigars a day    Continue to not smoke  Outpatient Encounter Medications as of 05/07/2020  Medication Sig   lactobacillus acidophilus (BACID) TABS tablet Take 2 tablets by mouth 2 (two) times daily.   No facility-administered encounter medications on file as of 05/07/2020.     Review of Systems  Review of Systems  No chest pain.  No cough.  No dyspnea exertion.  Comprehensive review of systems otherwise negative. Physical Exam  BP 112/60 (BP Location: Left Arm, Cuff Size: Normal)    Pulse 98    Temp 97.6 F (36.4 C) (Temporal)    Ht 5\' 8"  (1.727 m)    Wt 158 lb 12.8 oz (72 kg)    SpO2 100%    BMI 24.15 kg/m   Wt Readings from Last 5 Encounters:  05/07/20 158 lb 12.8 oz (72 kg)  04/24/20 150 lb (68 kg)  03/27/20 145 lb (65.8 kg)    BMI Readings from Last 5 Encounters:  05/07/20 24.15 kg/m  04/24/20 22.81 kg/m  03/27/20 22.05 kg/m     Physical Exam General: Well-appearing, no acute distress Eyes: EOMI, no icterus Respiratory: Personally  bilaterally, no crackles or wheezes CV: Regular rhythm, no murmurs   Assessment & Plan:   Cavitary pneumonia: Repeat CT with marked improvement s/p 4 weeks of Augmentin.  Counseled on ongoing smoking cessation, he was congratulated on quitting.   Return if symptoms worsen or fail to improve.   Lanier Clam, MD 05/07/2020

## 2020-05-07 NOTE — Patient Instructions (Signed)
I am glad you are doing well!  CT scan looks much better. No need for any more antibiotics or scans.  Continue to not smoke! This will help protect your lungs in the future.  Get a flu shot after you finish the COVID vaccines. Plan on the last week of October.   Come back if any new symptoms develop!

## 2020-05-12 LAB — ACID FAST CULTURE WITH REFLEXED SENSITIVITIES (MYCOBACTERIA): Acid Fast Culture: NEGATIVE

## 2020-05-15 LAB — ACID FAST CULTURE WITH REFLEXED SENSITIVITIES (MYCOBACTERIA): Acid Fast Culture: NEGATIVE

## 2020-06-25 ENCOUNTER — Ambulatory Visit: Payer: Self-pay | Admitting: Physician Assistant

## 2020-09-19 IMAGING — CT CT CHEST W/O CM
2 of 4 series · 15 of 36 positions shown, 18 images · non-contrast
Comparison: 03/28/2020.

CLINICAL DATA: Follow-up cavitary pneumonia involving the RIGHT
lung.

EXAM:
CT CHEST WITHOUT CONTRAST
TECHNIQUE: Multidetector CT imaging of the chest was performed following the
standard protocol without IV contrast.

[Series 2: thorax · axial · 0.72mm/px · z∈[-301,-31]mm · 12 of 161 slices shown, 15 images]
[im 13/161  mediastinal]
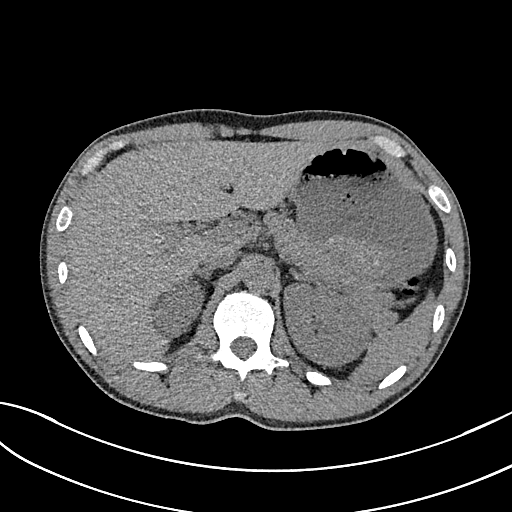
[im 13/161  lung]
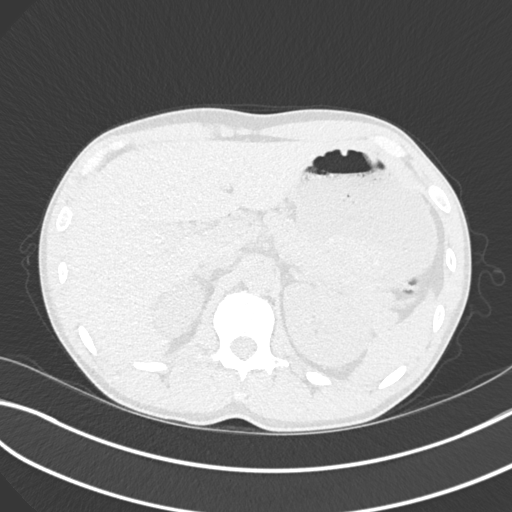
[im 25/161  lung]
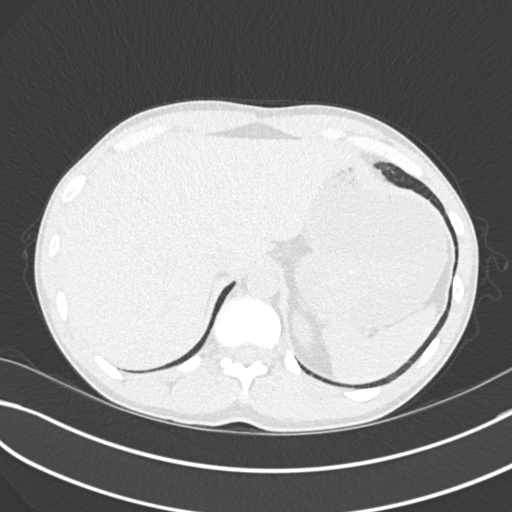
[im 37/161  lung]
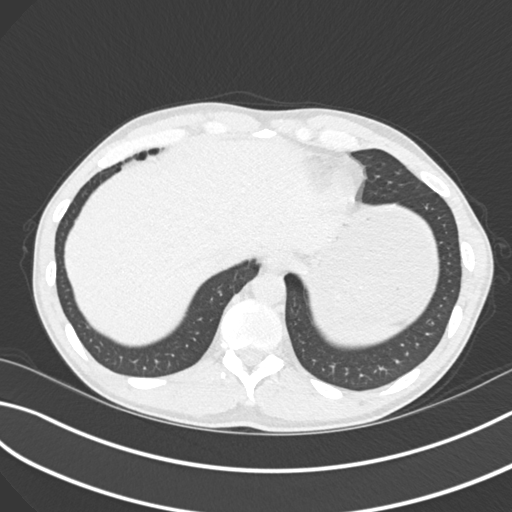
[im 50/161  lung]
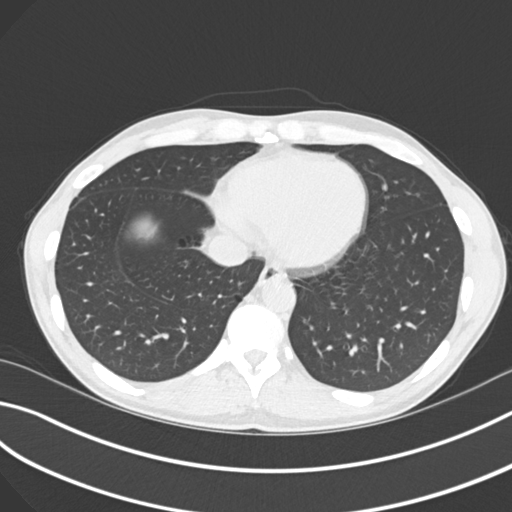
[im 62/161  mediastinal]
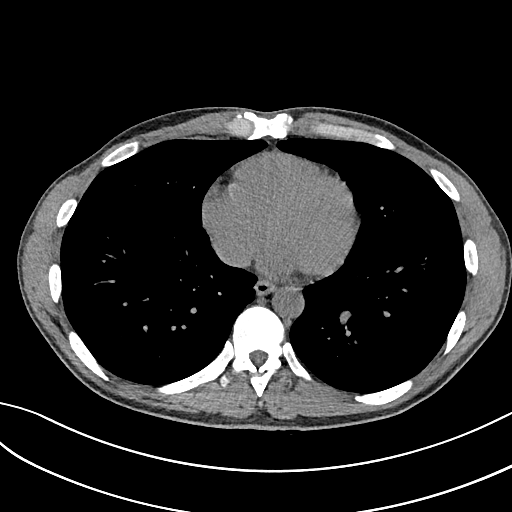
[im 62/161  lung]
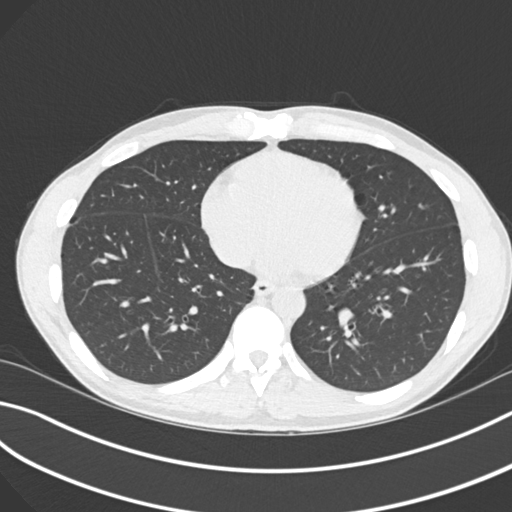
[im 74/161  lung]
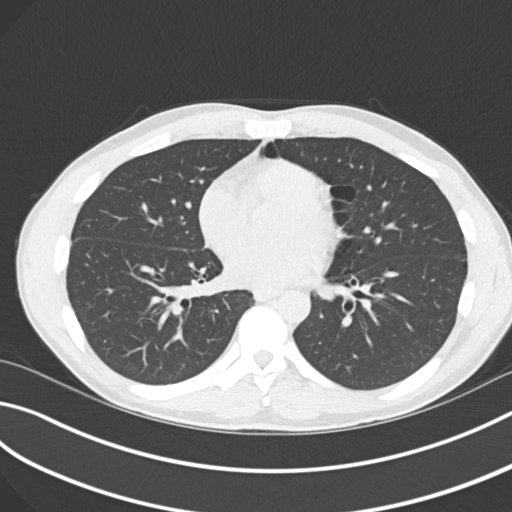
[im 87/161  lung]
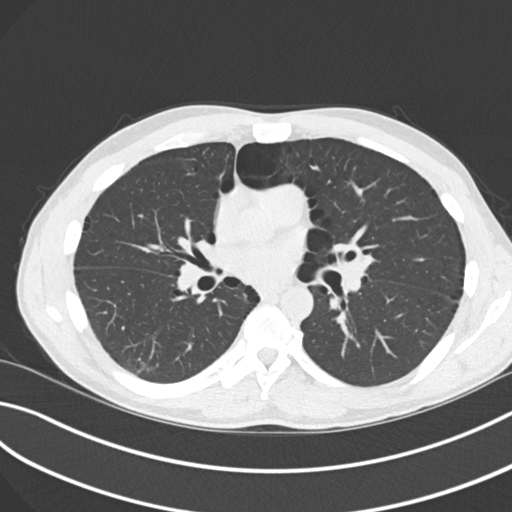
[im 99/161  lung]
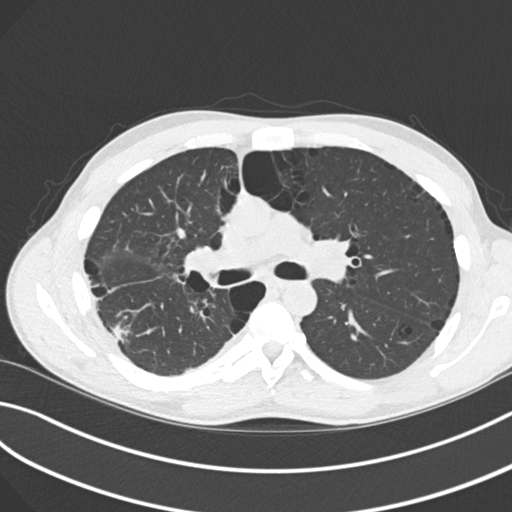
[im 111/161  mediastinal]
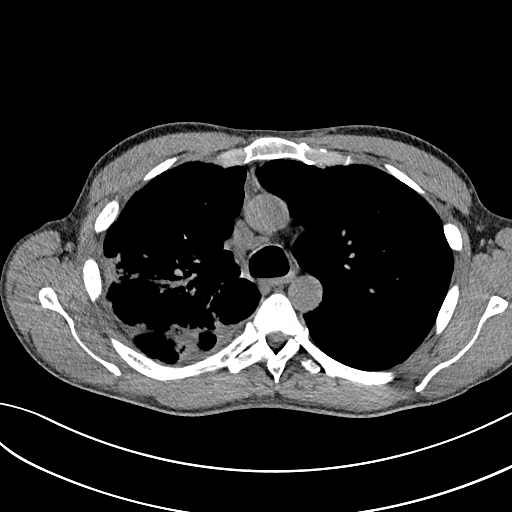
[im 111/161  lung]
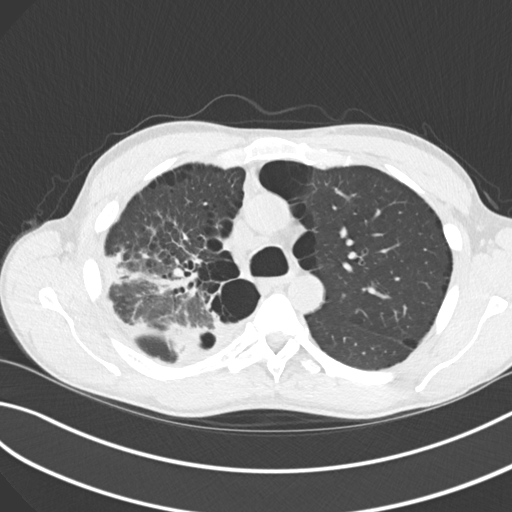
[im 124/161  lung]
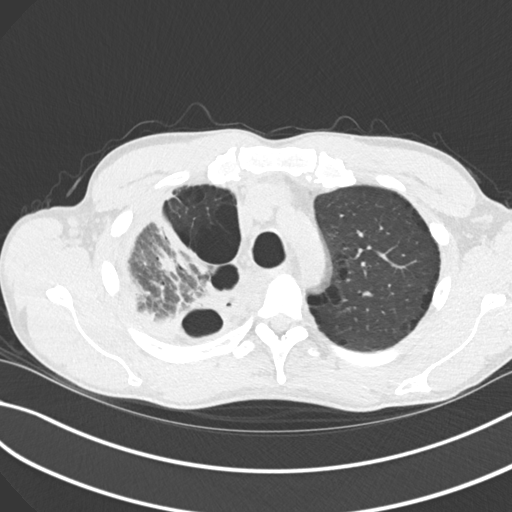
[im 136/161  lung]
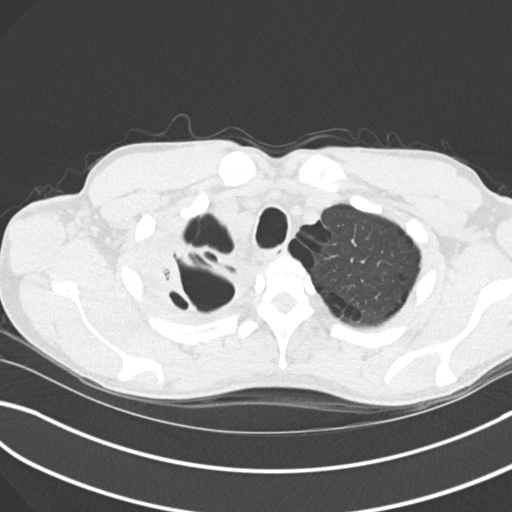
[im 148/161  lung]
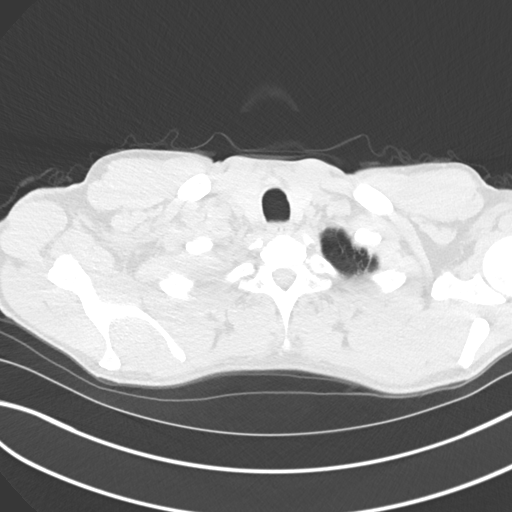

[Series 5: coronal · coronal · 0.67mm/px · 3 of 119 slices shown]
[im 24/119  lung]
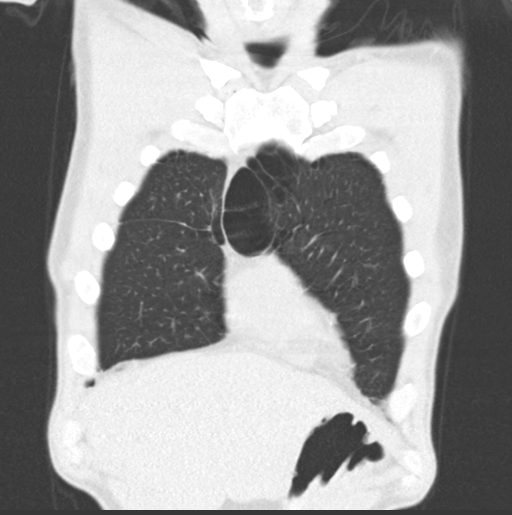
[im 48/119  lung]
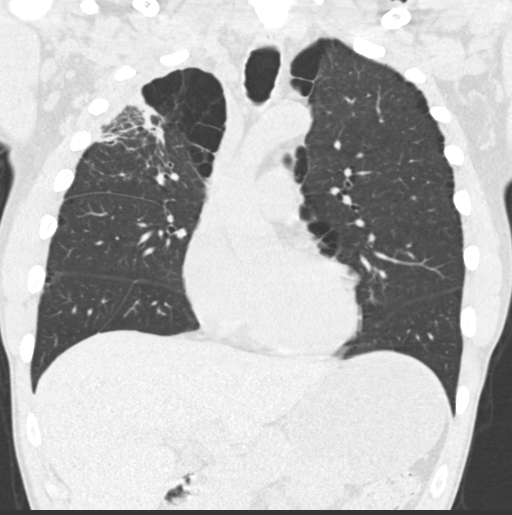
[im 71/119  lung]
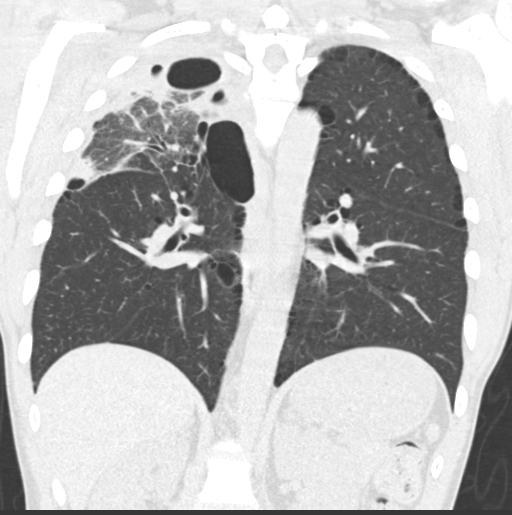

[15 of 36 positions shown; findings below may reference images not displayed]

FINDINGS: Cardiovascular: Stable normal heart size. No visible coronary
atherosclerosis. No pericardial effusion. No visible aortic
atherosclerosis.

Mediastinum/Nodes: No pathologic lymphadenopathy, allowing for the
unenhanced technique. The previously identified enlarged RIGHT hilar
lymph nodes have decreased in size in the interval. Normal appearing
esophagus. Normal-appearing thyroid gland.

Lungs/Pleura: Interval marked improvement in the cavitary pneumonia
in the RIGHT UPPER LOBE and superior segment RIGHT LOWER LOBE since
the prior CT 1 month ago. There are residual peripheral airspace
opacities in the RIGHT UPPER LOBE, with residual cavitation
involving the consolidation in the posterior RIGHT UPPER LOBE. No
new pulmonary parenchymal abnormalities in either lung. No
parenchymal nodules or masses. Severe bullous emphysematous changes
are present throughout the upper lobes of both lungs. No pleural
effusions. Central airways patent without significant bronchial wall
thickening.

Upper Abdomen: Unremarkable for the unenhanced technique.

Musculoskeletal: Regional skeleton unremarkable without acute or
significant osseous abnormality.
IMPRESSION: 1. Interval marked improvement in the cavitary pneumonia involving
the RIGHT UPPER LOBE and superior segment RIGHT LOWER LOBE since the
prior CT 1 month ago. There are residual peripheral airspace
opacities in the RIGHT UPPER LOBE, with residual cavitation
involving the consolidation in the posterior RIGHT UPPER LOBE.
2. No new pulmonary parenchymal abnormalities in either lung.
3. Interval decrease in size of the previously identified RIGHT
hilar lymph nodes, confirming benignity.

Emphysema ([id number]).
